# Patient Record
Sex: Male | Born: 1970 | Race: White | Hispanic: No | Marital: Single | Smoking: Current every day smoker
Health system: Southern US, Community
[De-identification: ages and names within clinical notes are randomized; demographics above are authoritative.]

## PROBLEM LIST (undated history)

## (undated) DIAGNOSIS — F419 Anxiety disorder, unspecified: Secondary | ICD-10-CM

## (undated) DIAGNOSIS — F320 Major depressive disorder, single episode, mild: Secondary | ICD-10-CM

## (undated) DIAGNOSIS — K219 Gastro-esophageal reflux disease without esophagitis: Secondary | ICD-10-CM

## (undated) DIAGNOSIS — G4700 Insomnia, unspecified: Secondary | ICD-10-CM

## (undated) DIAGNOSIS — F1910 Other psychoactive substance abuse, uncomplicated: Secondary | ICD-10-CM

## (undated) HISTORY — PX: VASECTOMY: SHX75

## (undated) HISTORY — DX: Insomnia, unspecified: G47.00

## (undated) HISTORY — DX: Anxiety disorder, unspecified: F41.9

## (undated) HISTORY — DX: Other psychoactive substance abuse, uncomplicated: F19.10

## (undated) HISTORY — DX: Gastro-esophageal reflux disease without esophagitis: K21.9

---

## 2006-11-16 ENCOUNTER — Emergency Department (HOSPITAL_COMMUNITY): Admission: EM | Admit: 2006-11-16 | Discharge: 2006-11-16 | Payer: Self-pay | Admitting: Emergency Medicine

## 2006-11-17 ENCOUNTER — Ambulatory Visit: Payer: Self-pay | Admitting: Psychiatry

## 2006-11-17 ENCOUNTER — Inpatient Hospital Stay (HOSPITAL_COMMUNITY): Admission: RE | Admit: 2006-11-17 | Discharge: 2006-11-19 | Payer: Self-pay | Admitting: Psychiatry

## 2006-11-24 ENCOUNTER — Emergency Department (HOSPITAL_COMMUNITY): Admission: EM | Admit: 2006-11-24 | Discharge: 2006-11-24 | Payer: Self-pay | Admitting: Emergency Medicine

## 2008-02-10 ENCOUNTER — Emergency Department (HOSPITAL_COMMUNITY): Admission: EM | Admit: 2008-02-10 | Discharge: 2008-02-10 | Payer: Self-pay | Admitting: Emergency Medicine

## 2009-09-25 ENCOUNTER — Emergency Department (HOSPITAL_COMMUNITY)
Admission: EM | Admit: 2009-09-25 | Discharge: 2009-09-25 | Payer: Self-pay | Source: Home / Self Care | Admitting: Emergency Medicine

## 2010-05-06 LAB — POCT RAPID STREP A (OFFICE): Streptococcus, Group A Screen (Direct): NEGATIVE

## 2010-06-04 NOTE — H&P (Signed)
NAME:  Theodore Gray, Theodore Gray NO.:  [id number]   MEDICAL RECORD NO.:  [id number]          PATIENT TYPE:  IPS   LOCATION:  0500                          FACILITY:  [facility]   PHYSICIAN:  Geoffery Lyons, M.D.      DATE OF BIRTH:  Jan 15, 1971   DATE OF ADMISSION:  11/17/2006  DATE OF DISCHARGE:                       PSYCHIATRIC ADMISSION ASSESSMENT   A 40 year old male voluntarily admitted November 17, 2006.   HISTORY OF PRESENT ILLNESS:  The patient presents with a history of  substance use and depression, has been drinking beer 2-3 times a week,  with his weekend intake increases to 8-12 beers at a time or a pint of  liquor.  His last drink was on Sunday.  Denies drinking in the morning,  denies any seizure activity.  Has been drinking for years but notices  now that it  is changing behavior.  He gets kind of violent, kicks in  doors and punches walls.  He states he gets very angry over separation  from his wife.  Also has been using cocaine, marijuana and Klonopin.  He  states his mood switches quickly, gets very tearful for no reason,  drinks at times to help him sleep.  States his mind races at night  otherwise.  He is here to get detoxed.   PAST PSYCHIATRIC HISTORY:  First admission to [facility].  No other prior detox,  no outpatient mental health services.  Longest  history of sobriety has been 2 weeks, just to see if he can do it.   SOCIAL HISTORY:  He is a 40 year old separated male, has been separated  for 1 year, married for 16 years.  He lives with his two children ages  9 and 22.  The patient works as a Database administrator, recently got  insurance, no legal problems.   FAMILY HISTORY:  None.   ALCOHOL DRUG HISTORY:  The patient smokes 2-3 packs a day.  Denies any  IV drug use.  Again has been using occasional Klonopin, cocaine and  marijuana.   PRIMARY CARE Nisaiah Bechtol:  None.   MEDICAL PROBLEMS:  No known medical problems, although he does report  recently hit a wall with his right.  He states he has sustained past  fractures in his right hand, did have his hand x-rayed and he states it  was noted to be only a contusion.   MEDICATIONS:  None prior to admission.   DRUG ALLERGIES:  To PENICILLIN from childhood, cannot recall any  reaction.   PHYSICAL EXAM:  The patient was fully assessed at  [facility] emergency  department.  His urine drug screen is positive for benzodiazepines,  positive for THC, positive for cocaine.  CBC was within normal limits.  His alcohol level was less than five.  His CMET was within normal limits  as well.   MENTAL STATUS EXAM:  He is fully alert, cooperative, good eye contact,  casually dressed.  Speech is clear, normal pace and tone.  The patient's  mood is depressed.  Affect is, he is polite.  He gets teary-eyed at  times.  Thought process are, no evidence of any thought disorder, no  suicidal thoughts, cognitive  function intact.  His memory is good.  Judgment and insight is good.  Poor impulse control.   AXIS I:  Polysubstance dependence, rule out substance induced mood  disorder.  AXIS II:  Deferred.  AXIS III:  No known medical problems, recent contusion, right hand  AXIS IV:  Psychosocial problems, problems related to a primary support  group and separation from his wife.  AXIS V:  Current is 35-40.   PLAN:  Contract for safety.  Stabilize mood and thinking.  Will detox  the patient with Librium protocol, work on relapse prevention.  Risk and  benefits of antidepressants were discussed.  The patient is agreeable to  beginning medications.  The patient will also have trazodone for sleep,  consider family session with his wife if feasible.  Case manager will  obtain a follow up with the patient.   Length of stay is 3-4 days.      Landry Corporal, N.P.      Geoffery Lyons, M.D.  Electronically Signed    JO/MEDQ  D:  11/18/2006  T:  11/18/2006  Job:  [id number]

## 2010-06-07 NOTE — Discharge Summary (Signed)
NAME:  [name], [name] NO.:  [id number]   MEDICAL RECORD NO.:  [id number]          PATIENT TYPE:  IPS   LOCATION:  0500                          FACILITY:  [facility]   PHYSICIAN:  Geoffery Lyons, M.D.      DATE OF BIRTH:  22-Jan-1970   DATE OF ADMISSION:  11/17/2006  DATE OF DISCHARGE:  11/19/2006                               DISCHARGE SUMMARY   CHIEF COMPLAINT AND PRESENT ILLNESS:  This was the first admission to  [facility] for this 40 year old male voluntarily  admitted.  History of substance abuse and depression, had been drinking  beer 2 or 3 times a week, weekend intake increased to 8-12 beers and/or  a pint of liquor.  Last drink was on Sunday.  Drinking for years and now  __________  that it is changing his behavior, gets violent, kicks in  door and punches wall.  Angry over the separation from his wife.  Using  cocaine, marijuana and Klonopin.  Claimed his mood switches quickly,  tearful for no reason, mind races.   PAST PSYCHIATRIC HISTORY:  [facility].  No other  detox, longest sobriety 2 weeks ago.   ALCOHOL AND DRUG HISTORY:  Using occasional Klonopin, cocaine and  marijuana, and drinking 8-12 beers 2 or 3 times a week.   MEDICAL HISTORY:  Noncontributory.   MEDICATIONS:  None.   PHYSICAL EXAM:  Performed, no acute findings.   LABORATORY WORKUP:  UDS positive for benzodiazepines, marijuana and  cocaine.  CBC within normal limits.  __________  within normal limits.   MENTAL STATUS EXAM:  Reveals a fully alert cooperative male with good  eye contact.  Casually dressed.  Speech was clear, normal pace and tone.  Mood depressed.  Affect depressed.  Teary-eyed at times.  Thought  processes are clear, rational and goal oriented.  No active suicidal or  homicidal ideas, no hallucinations.  Cognition well-preserved.   ADMITTING DIAGNOSES:  Axis I:  Alcohol, marijuana, cocaine and  benzodiazepine abuse, rule out dependence;  impulse control, not  otherwise specified; rule out substance-induced mood disorder.  Axis II:  No diagnosis.  Axis III:  No diagnosis.  Axis IV: Moderate.  Axis V:  Upon admission 35, highest Global Assessment of Functioning in  the last year 60.   COURSE IN THE HOSPITAL:  He was admitted, started in individual and  group psychotherapy.  We detoxed with Librium.  We started him on  trazodone for sleep, endorsed that for years has had a love and hate  relationship with his wife.  One day she left, took the kids with her,  and they did not come back.  He actually has a girlfriend, got into  drinking a 6-pack of beer a day, some cocaine, drinking almost every  day.  Endorsed temper outbursts, can go from 0 to 12.  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  The father  has history of bad temper, grandfather alcohol.  Endorsed that Saturday  before he was admitted he got  really upset and increased his use of  alcohol and cocaine.  October 30 he endorsed no suicidal ideas, no  hallucinations or delusions.  He states he was ready go home.  Family  was going to be visiting and he really wanted to be discharged.  He was  willing to pursue outpatient treatment and he was committed to sobriety.  As he was indeed improved and there were no indications of acute  withdrawal or suicidal or homicidal ideations, and the family was okay  with him being discharged, we went ahead and discharged to outpatient  follow-up.   DISCHARGE DIAGNOSES:  Axis I:  Alcohol and cocaine, benzodiazepine,  marijuana abuse; impulse control, not otherwise specified; rule out  substance-induced mood disorder.  Axis II:  No diagnosis.  Axis III:  No diagnosis  Axis IV:  Moderate.  Axis V:  Upon discharge 55-60.   DISCHARGED ON:  1. Wellbutrin XL 150 mg in the morning.  2. Trazodone 50 mg at bedtime for sleep.   FOLLOW-UP PLANS:  On an outpatient basis.      Geoffery Lyons, M.D.   Electronically Signed     IL/MEDQ  D:  12/10/2006  T:  12/11/2006  Job:  [id number]

## 2010-10-29 LAB — DIFFERENTIAL
Basophils Absolute: 0
Eosinophils Absolute: 0.3
Eosinophils Relative: 3
Lymphocytes Relative: 30
Monocytes Relative: 8
Neutrophils Relative %: 58

## 2010-10-29 LAB — CBC
HCT: 40.5
MCHC: 34.9
Platelets: 238
WBC: 8.8

## 2010-10-29 LAB — ETHANOL: Alcohol, Ethyl (B): 180 — ABNORMAL HIGH

## 2010-10-29 LAB — BASIC METABOLIC PANEL
CO2: 31
Chloride: 105
GFR calc non Af Amer: >60
Sodium: 142

## 2010-10-29 LAB — URINALYSIS, ROUTINE W REFLEX MICROSCOPIC
Glucose, UA: NEGATIVE
Nitrite: NEGATIVE

## 2010-10-29 LAB — RAPID URINE DRUG SCREEN, HOSP PERFORMED
Amphetamines: NOT DETECTED
Benzodiazepines: POSITIVE — AB
Opiates: NOT DETECTED

## 2010-10-29 LAB — URINE MICROSCOPIC-ADD ON

## 2010-10-30 LAB — DIFFERENTIAL
Basophils Absolute: 0
Basophils Relative: 0
Eosinophils Relative: 2
Lymphocytes Relative: 22
Lymphs Abs: 2.2
Monocytes Relative: 8
Neutro Abs: 6.9

## 2010-10-30 LAB — COMPREHENSIVE METABOLIC PANEL
Alkaline Phosphatase: 66
CO2: 32
Chloride: 105

## 2010-10-30 LAB — URINALYSIS, ROUTINE W REFLEX MICROSCOPIC
Bilirubin Urine: NEGATIVE
Ketones, ur: NEGATIVE
Nitrite: NEGATIVE
Urobilinogen, UA: 1

## 2010-10-30 LAB — CBC
MCHC: 35
MCV: 90.2
Platelets: 246
RBC: 4.96
RDW: 12.5

## 2010-10-30 LAB — RAPID URINE DRUG SCREEN, HOSP PERFORMED
Cocaine: POSITIVE — AB
Opiates: NOT DETECTED
Tetrahydrocannabinol: POSITIVE — AB

## 2011-06-26 ENCOUNTER — Encounter: Payer: Self-pay | Admitting: Gastroenterology

## 2011-07-14 ENCOUNTER — Ambulatory Visit: Payer: Self-pay | Admitting: Gastroenterology

## 2011-07-14 ENCOUNTER — Telehealth: Payer: Self-pay | Admitting: Gastroenterology

## 2011-07-14 NOTE — Telephone Encounter (Signed)
Pt was a no show

## 2011-07-14 NOTE — Telephone Encounter (Signed)
PCP is aware that patient has a nonworking phone and appt card was mailed to him.

## 2012-03-11 ENCOUNTER — Encounter: Payer: Self-pay | Admitting: Family Medicine

## 2012-03-11 DIAGNOSIS — F17200 Nicotine dependence, unspecified, uncomplicated: Secondary | ICD-10-CM | POA: Insufficient documentation

## 2012-03-11 DIAGNOSIS — K219 Gastro-esophageal reflux disease without esophagitis: Secondary | ICD-10-CM

## 2012-03-11 DIAGNOSIS — F411 Generalized anxiety disorder: Secondary | ICD-10-CM | POA: Insufficient documentation

## 2012-03-11 DIAGNOSIS — F1010 Alcohol abuse, uncomplicated: Secondary | ICD-10-CM | POA: Insufficient documentation

## 2012-03-11 DIAGNOSIS — G4700 Insomnia, unspecified: Secondary | ICD-10-CM | POA: Insufficient documentation

## 2012-04-13 ENCOUNTER — Encounter: Payer: Self-pay | Admitting: Family Medicine

## 2012-04-13 ENCOUNTER — Ambulatory Visit (INDEPENDENT_AMBULATORY_CARE_PROVIDER_SITE_OTHER): Payer: BC Managed Care – PPO | Admitting: Family Medicine

## 2012-04-13 VITALS — BP 136/78 | HR 93 | Temp 98.1°F | Resp 18 | Wt 184.0 lb

## 2012-04-13 DIAGNOSIS — G4700 Insomnia, unspecified: Secondary | ICD-10-CM

## 2012-04-13 DIAGNOSIS — K219 Gastro-esophageal reflux disease without esophagitis: Secondary | ICD-10-CM

## 2012-04-13 DIAGNOSIS — M5380 Other specified dorsopathies, site unspecified: Secondary | ICD-10-CM

## 2012-04-13 DIAGNOSIS — F411 Generalized anxiety disorder: Secondary | ICD-10-CM

## 2012-04-13 DIAGNOSIS — M62830 Muscle spasm of back: Secondary | ICD-10-CM

## 2012-04-13 MED ORDER — CARISOPRODOL 350 MG PO TABS: 350.0000 mg | ORAL_TABLET | Freq: Four times a day (QID) | ORAL | Status: DC | PRN

## 2012-04-13 MED ORDER — OMEPRAZOLE 20 MG PO CPDR: 20.0000 mg | DELAYED_RELEASE_CAPSULE | Freq: Every day | ORAL | Status: DC

## 2012-04-13 NOTE — Progress Notes (Signed)
Subjective:     Patient ID: Theodore Gray, male   DOB: Oct 31, 1970, 42 y.o.   MRN: [id number]  HPI Patient is here for followup of his anxiety disorder/insomnia-  he's been taking Zoloft 50 mg by mouth daily for the last 2 months. He reports no benefit and has not noticed any perceptible change in his anxiety. Furthermore is not able to sleep. He reports a restless in his legs at night movement the states for his restlessness in his limbs keeps him awake. His girlfriend frequently complains about him kicking her or bumping into her his legs while he sleeps.  He denies any depression or suicidal thoughts.  2 weeks ago he was lifting a truck body, he felt a tearing sensation just medial to his left scapula.  He has had near constant pain in that area ever since. There is no radiation of the pain. Denies any numbness or tingling in his left arm. He denies any neck pain. He denies any paresthesias in his left leg her right leg. He denies any shortness of breath, dyspnea on exertion, chest pain, angina, nausea or vomiting.  He denies any pleurisy.  He denies any shoulder pain. Past Medical History  Diagnosis Date  . GERD (gastroesophageal reflux disease)   . Substance abuse     ALCOHOL  . Anxiety   . Insomnia    His medicines include Prilosec 20 mg by mouth daily and Zoloft 50 mg by mouth daily. He has an allergy to penicillin History   Social History  . Marital Status: Single    Spouse Name: N/A    Number of Children: N/A  . Years of Education: N/A   Occupational History  . Not on file.   Social History Main Topics  . Smoking status: Current Every Day Smoker -- 1.50 packs/day    Types: Cigarettes  . Smokeless tobacco: Never Used  . Alcohol Use: No  . Drug Use: No  . Sexually Active: Not on file   Other Topics Concern  . Not on file   Social History Narrative  . No narrative on file       Review of Systems    Review of systems is otherwise negative  Objective:   Physical  Exam  Constitutional: He appears well-developed and well-nourished. No distress.  HENT:  Head: Normocephalic and atraumatic.  Right Ear: External ear normal.  Left Ear: External ear normal.  Mouth/Throat: Oropharynx is clear and moist. No oropharyngeal exudate.  Eyes: Conjunctivae and EOM are normal. Pupils are equal, round, and reactive to light.  Neck: Normal range of motion. Neck supple. No JVD present. No tracheal deviation present. No thyromegaly present.  Cardiovascular: Normal rate, regular rhythm and normal heart sounds.  Exam reveals no gallop and no friction rub.   No murmur heard. Pulmonary/Chest: Effort normal and breath sounds normal. No respiratory distress. He has no wheezes. He has no rales. He exhibits no tenderness.  Abdominal: Soft. Bowel sounds are normal. He exhibits no distension and no mass. There is no tenderness. There is no rebound and no guarding.  Musculoskeletal:       Thoracic back: He exhibits tenderness, pain and spasm. He exhibits normal range of motion, no bony tenderness, no swelling, no edema and no deformity.       Back:  Lymphadenopathy:    He has no cervical adenopathy.  Skin: He is not diaphoretic.   location of the pain is circled on drawing.  He has pain with subscapularis  liftoff.  He has a negative Hawkins and a negative empty can sign. He has a negative Spurling's maneuver.      Assessment:     Thoracic muscle strain  Anxiety disorder  Insomnia  Possible restless limb syndrome  GERD    Plan:     Zoloft has not been effective. Therefore am asking him to decrease it to 25 mg by mouth daily for the next 2 weeks and then discontinued altogether.  For his muscle strain I'm prescribing soma 350 mg by mouth every 6 hours when necessary muscle spasms. I warned him about sedation on this medicine and possible habituation.  After 2 weeks, assuming his muscle pain is better, I will try him on trazodone 100 mg by mouth each bedtime for insomnia.  If  trazodone is ineffective we can try medicines for restless leg syndrome such as Requip..  I refilled his omeprazole as he has daily GERD.

## 2012-04-30 ENCOUNTER — Encounter: Payer: Self-pay | Admitting: Physician Assistant

## 2012-04-30 ENCOUNTER — Telehealth: Payer: Self-pay | Admitting: Physician Assistant

## 2012-04-30 ENCOUNTER — Ambulatory Visit (INDEPENDENT_AMBULATORY_CARE_PROVIDER_SITE_OTHER): Payer: BC Managed Care – PPO | Admitting: Physician Assistant

## 2012-04-30 VITALS — BP 136/100 | HR 76 | Temp 98.7°F | Resp 18 | Ht 68.0 in | Wt 188.0 lb

## 2012-04-30 DIAGNOSIS — F411 Generalized anxiety disorder: Secondary | ICD-10-CM

## 2012-04-30 DIAGNOSIS — M546 Pain in thoracic spine: Secondary | ICD-10-CM

## 2012-04-30 DIAGNOSIS — G4700 Insomnia, unspecified: Secondary | ICD-10-CM

## 2012-04-30 DIAGNOSIS — K219 Gastro-esophageal reflux disease without esophagitis: Secondary | ICD-10-CM

## 2012-04-30 MED ORDER — TRAMADOL HCL 50 MG PO TABS: 50.0000 mg | ORAL_TABLET | Freq: Four times a day (QID) | ORAL | Status: DC | PRN

## 2012-04-30 MED ORDER — SERTRALINE HCL 50 MG PO TABS: 50.0000 mg | ORAL_TABLET | Freq: Every day | ORAL | Status: DC

## 2012-04-30 NOTE — Telephone Encounter (Signed)
Prescriptions was sent to wrong pharmacy([facility]) sent rx to right pharmacy

## 2012-05-01 NOTE — Progress Notes (Signed)
Patient ID: Theodore Gray MRN: [id number], DOB: [birth year], 42 y.o. Date of Encounter: @DATE @  Chief Complaint:  Chief Complaint  Patient presents with  . Medication Problem    see last ov 3/25  prob with back pain/anxiety meds    HPI: 42 y.o. year old male  presents with:  1-Back Pain in upper back. Was seen here a couple weeks ago- was given muscle relaxer-says this did not help at all. Still has significant pain. 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 Says he needs something to take for pain that he can take while at work. Also wants xray.  2-Anxiety: Was seen 02/23/12 here and started Zoloft 50. When he was seen couple weeks ago reg back pain, he reported that the zoloft wasn't helping so this was weaned off. However, today pt and girlfriend both report that it was helping. He says "I guess I just didn't realize how much better I was with the medicine" but since stopping it, definitely feels much worse. He says that when he was on Zoloft coworkers commented how much better he was. Girlfriend says she noticed that it did help. Pt now realizes that he feels much, much worse and more anxious since off the med.   Past Medical History  Diagnosis Date  . GERD (gastroesophageal reflux disease)   . Substance abuse     ALCOHOL  . Anxiety   . Insomnia      Home Meds: Current Outpatient Prescriptions on File Prior to Visit  Medication Sig Dispense Refill  . omeprazole (PRILOSEC) 20 MG capsule Take 1 capsule (20 mg total) by mouth daily.  30 capsule  11  . carisoprodol (SOMA) 350 MG tablet Take 1 tablet (350 mg total) by mouth 4 (four) times daily as needed for muscle spasms.  60 tablet  0   No current facility-administered medications on file prior to visit.    Allergies:  Allergies  Allergen Reactions  . Penicillins Rash    History    Social History  . Marital Status: Single    Spouse Name: N/A    Number of Children: N/A  . Years of Education: N/A   Occupational History  . Not on file.   Social History Main Topics  . Smoking status: Current Every Day Smoker -- 1.50 packs/day    Types: Cigarettes  . Smokeless tobacco: Never Used  . Alcohol Use: No  . Drug Use: No  . Sexually Active: Not on file   Other Topics Concern  . Not on file   Social History Narrative  . No narrative on file    No family history on file.   Review of Systems:No numbness, tingling in back Constitutional: negative for chills, fever, night sweats, weight changes, or fatigue  Respiratory: negative for hemoptysis, wheezing, shortness of breath, or cough Abdominal: negative for abdominal pain, nausea, vomiting, diarrhea, or constipation Dermatological: negative for rash or concerning skin lesions Neurologic: negative for headache, dizziness, or syncope All other systems reviewed and are otherwise negative with the exception to those above and in the HPI.   Physical Exam: Blood pressure 136/100, pulse 76, temperature 98.7 F (37.1 C), temperature source Oral, resp. rate 18, height 5\' 8"  (1.727 m), weight 188 lb (85.276 kg)., Body mass index is 28.59 kg/(m^2). General: Well developed, well nourished,WM with tattoos, talks fast, anxious. in  no acute distress. Neck: Supple. No thyromegaly. Full ROM. No lymphadenopathy. Lungs: Clear bilaterally to auscultation without wheezes, rales, or rhonchi. Breathing is unlabored. Heart: RRR. Musculoskeletal:  Back, medial aspect of scapula, is area of tenderness, tightness with palpation. No pain in midline at spine. Extremities/Skin: Warm and dry. No clubbing or cyanosis. No edema. No rashes or suspicious lesions. Neuro: Alert and oriented X 3. Moves all extremities spontaneously. Gait is normal. CNII-XII grossly in tact. Psych:  He talks very fast, anxious.    ASSESSMENT AND PLAN:  42 y.o.  year old male with  1. GAD (generalized anxiety disorder) Will restartZoloft 50 mg QD. Will return for f/u in 6 weeks.  2. Thoracic back pain Will obtain xray. Will prescribe Tramadol for pain. He is to use heat and stretch routinely. - DG Thoracic Spine W/Swimmers; Future  3. GERD (gastroesophageal reflux disease) Controlled with omeprazole.  4. Insomnia He is having difficulty sleeping. Will use tramadol for pain and zoloft for anxiety then f/u this at the f/u in 6 weeks.   [address] [place], [place], [facility] 05/01/2012 7:35 AM

## 2012-05-03 ENCOUNTER — Telehealth: Payer: Self-pay | Admitting: Physician Assistant

## 2012-05-03 DIAGNOSIS — M62830 Muscle spasm of back: Secondary | ICD-10-CM

## 2012-05-03 MED ORDER — CARISOPRODOL 350 MG PO TABS: 350.0000 mg | ORAL_TABLET | Freq: Four times a day (QID) | ORAL | Status: DC | PRN

## 2012-05-03 NOTE — Telephone Encounter (Signed)
Seen by Dr Tanya Nones on 3/25 for muscle spasms.  Given carisoprodol 350 QID prn #30.  Pt now requesting refill.  Please advise.

## 2012-05-03 NOTE — Telephone Encounter (Signed)
Medication refilled per protocol.Patient needs to be seen before any further refills 

## 2012-05-03 NOTE — Telephone Encounter (Signed)
Ok to refill #30

## 2012-05-05 ENCOUNTER — Ambulatory Visit
Admission: RE | Admit: 2012-05-05 | Discharge: 2012-05-05 | Disposition: A | Discharge status: Home / Self Care | Payer: BC Managed Care – PPO | Source: Ambulatory Visit | Attending: Physician Assistant | Admitting: Physician Assistant

## 2012-05-05 DIAGNOSIS — M546 Pain in thoracic spine: Secondary | ICD-10-CM

## 2012-05-10 ENCOUNTER — Telehealth: Payer: Self-pay | Admitting: Physician Assistant

## 2012-05-10 DIAGNOSIS — M62830 Muscle spasm of back: Secondary | ICD-10-CM

## 2012-05-10 MED ORDER — CARISOPRODOL 350 MG PO TABS: 350.0000 mg | ORAL_TABLET | Freq: Four times a day (QID) | ORAL | Status: DC | PRN

## 2012-05-10 NOTE — Telephone Encounter (Signed)
cARISOPRODOL 350MG  TAKE 1 TABLET 4 TIMES A DAY AS NEEDED FOR MUSCLE SPASMS #60 LAST RF 04/13/12

## 2012-05-10 NOTE — Addendum Note (Signed)
Addended by: Donne Anon on: 05/10/2012 03:56 PM   Modules accepted: Orders

## 2012-05-10 NOTE — Telephone Encounter (Signed)
Approved. #90+ one additional refill 

## 2012-05-10 NOTE — Telephone Encounter (Signed)
Last OV 04/30/12.  Last refill 4/14 #30. Need approval.

## 2012-05-10 NOTE — Telephone Encounter (Signed)
Med called to pharmacy 

## 2012-07-05 ENCOUNTER — Telehealth: Payer: Self-pay | Admitting: Family Medicine

## 2012-07-05 DIAGNOSIS — F411 Generalized anxiety disorder: Secondary | ICD-10-CM

## 2012-07-05 NOTE — Telephone Encounter (Signed)
MBD pt 

## 2012-07-06 MED ORDER — SERTRALINE HCL 50 MG PO TABS: 50.0000 mg | ORAL_TABLET | Freq: Every day | ORAL | Status: DC

## 2012-07-06 NOTE — Telephone Encounter (Signed)
Medication refilled per protocol. 

## 2012-07-14 ENCOUNTER — Encounter: Payer: Self-pay | Admitting: Physician Assistant

## 2012-07-14 ENCOUNTER — Ambulatory Visit (INDEPENDENT_AMBULATORY_CARE_PROVIDER_SITE_OTHER): Payer: BC Managed Care – PPO | Admitting: Physician Assistant

## 2012-07-14 VITALS — BP 116/86 | HR 68 | Temp 98.3°F | Resp 18 | Ht 69.5 in | Wt 182.0 lb

## 2012-07-14 DIAGNOSIS — F411 Generalized anxiety disorder: Secondary | ICD-10-CM

## 2012-07-14 DIAGNOSIS — F1010 Alcohol abuse, uncomplicated: Secondary | ICD-10-CM

## 2012-07-14 DIAGNOSIS — F17200 Nicotine dependence, unspecified, uncomplicated: Secondary | ICD-10-CM

## 2012-07-14 DIAGNOSIS — L0390 Cellulitis, unspecified: Secondary | ICD-10-CM

## 2012-07-14 DIAGNOSIS — K219 Gastro-esophageal reflux disease without esophagitis: Secondary | ICD-10-CM

## 2012-07-14 DIAGNOSIS — L0291 Cutaneous abscess, unspecified: Secondary | ICD-10-CM

## 2012-07-14 DIAGNOSIS — G4700 Insomnia, unspecified: Secondary | ICD-10-CM

## 2012-07-14 DIAGNOSIS — G2581 Restless legs syndrome: Secondary | ICD-10-CM

## 2012-07-14 MED ORDER — DOXYCYCLINE HYCLATE 100 MG PO TABS: 100.0000 mg | ORAL_TABLET | Freq: Two times a day (BID) | ORAL | Status: DC

## 2012-07-14 MED ORDER — SULFAMETHOXAZOLE-TMP DS 800-160 MG PO TABS: 1.0000 | ORAL_TABLET | Freq: Two times a day (BID) | ORAL | Status: DC

## 2012-07-14 MED ORDER — ROPINIROLE HCL 0.25 MG PO TABS: 0.2500 mg | ORAL_TABLET | Freq: Three times a day (TID) | ORAL | Status: DC

## 2012-07-14 MED ORDER — SERTRALINE HCL 100 MG PO TABS: 100.0000 mg | ORAL_TABLET | Freq: Every day | ORAL | Status: DC

## 2012-07-14 NOTE — Progress Notes (Signed)
Patient ID: Theodore Gray MRN: [id number], DOB: July 18, 1970, 42 y.o. Date of Encounter: @DATE @  Chief Complaint:  Chief Complaint  Patient presents with  . wants to discuss zoloft, sleeping, restless leg  . infection in left index finger    HPI: 42 y.o. year old white male  With lots of tattoos presents for f/u OV.   1-Anxiety: At LOV 02/23/12 he reported that he "felt on edge all the time, felt overwhelmed at times, and that mind races" . Couldn't sleep /c mind racing. Started Zolft 50mg  QD. He says this has helped. He feel like he has more patience. He thinks his mind racing may have gotten a little better.   2-Insomnia: In addition to not being able to sleep good sec to anxiety, he also says he doesn't sleep good b/c of legs. Says that at night when sitting still feels a creepy crawly feeling on legs. Has to get up and walk around. Also during sleep, is awoken b/c of his legs "jump' throughout the night.   3-left 2nd finger: "knocked off surface of knuckle 4-5 weeks ago' but just deeloped white area 4-5 days ago. "I'm always getting banged up--didn't think much about it."   4- Still using no alcohol!! 5- Smoking is down to 1 ppd.  No other complaints.   Past Medical History  Diagnosis Date  . GERD (gastroesophageal reflux disease)   . Substance abuse     ALCOHOL  . Anxiety   . Insomnia      Home Meds: See attached medication section for current medication list. Any medications entered into computer today will not appear on this note's list. The medications listed below were entered prior to today. Current Outpatient Prescriptions on File Prior to Visit  Medication Sig Dispense Refill  . omeprazole (PRILOSEC) 20 MG capsule Take 1 capsule (20 mg total) by mouth daily.  30 capsule  11  . carisoprodol (SOMA) 350 MG tablet Take 1 tablet (350 mg total) by mouth 4 (four) times daily as needed for muscle spasms.  90 tablet  1  . traMADol (ULTRAM) 50 MG tablet Take 1 tablet (50 mg  total) by mouth every 6 (six) hours as needed for pain.  60 tablet  0   No current facility-administered medications on file prior to visit.    Allergies:  Allergies  Allergen Reactions  . Penicillins Rash    History   Social History  . Marital Status: Single    Spouse Name: N/A    Number of Children: N/A  . Years of Education: N/A   Occupational History  . Not on file.   Social History Main Topics  . Smoking status: Current Every Day Smoker -- 1.50 packs/day    Types: Cigarettes  . Smokeless tobacco: Never Used  . Alcohol Use: No  . Drug Use: No  . Sexually Active: Not on file   Other Topics Concern  . Not on file   Social History Narrative  . No narrative on file    No family history on file.   Review of Systems:  See HPI for pertinent ROS. All other ROS negative.    Physical Exam: Blood pressure 116/86, pulse 68, temperature 98.3 F (36.8 C), temperature source Oral, resp. rate 18, height 5' 9.5" (1.765 m), weight 182 lb (82.555 kg)., Body mass index is 26.5 kg/(m^2). General: WNWD WM Multiple Tattoos. Appears in no acute distress. Lungs: Clear bilaterally to auscultation without wheezes, rales, or rhonchi. Breathing is unlabored. Heart:  RRR with S1 S2. No murmurs, rubs, or gallops. Musculoskeletal:  Strength and tone normal for age. Extremities/Skin: Left 2nd Finger; at DIP joint: 1 cm diameter of erythema.   1/2 cm area of opague yellowish area.  Neuro: Alert and oriented X 3. Moves all extremities spontaneously. Gait is normal. CNII-XII grossly in tact. Psych:  Responds to questions appropriately with a normal affect.     ASSESSMENT AND PLAN:  42 y.o. year old male with  1. Abscess Site Cleansed. Scapel used to incise and drain. Culture sent. Take abx. If site worsens or does not resolve, f/u.  - Culture, routine-abscess - sulfamethoxazole-trimethoprim (BACTRIM DS) 800-160 MG per tablet; Take 1 tablet by mouth 2 (two) times daily.  Dispense: 28  tablet; Refill: 0 - doxycycline (VIBRA-TABS) 100 MG tablet; Take 1 tablet (100 mg total) by mouth 2 (two) times daily.  Dispense: 20 tablet; Refill: 0  2. GAD (generalized anxiety disorder) Symptoms are improved but not controlled. Will increase Zoloft from 50mg  to 100mg .  - sertraline (ZOLOFT) 100 MG tablet; Take 1 tablet (100 mg total) by mouth daily.  Dispense: 30 tablet; Refill: 3  3. Insomnia This should improve with increase in zoloft and with requip use.  - COMPLETE METABOLIC PANEL WITH GFR  4. Restless leg syndrome Will check iron etc to R/O underlying etiology. If labs normal, will start Requip and increase dose as below. If still not controlled on 2 pills, he is to call for further instructions regarding titration of medication. - COMPLETE METABOLIC PANEL WITH GFR - CBC with Differential - Iron - rOPINIRole (REQUIP) 0.25 MG tablet;  Take one 1-3 hours prior to sleep for 2 days then increase to two-1-3 hours prior to sleep.  Dispense: 60 tablet; Refill: 0  5. GERD (gastroesophageal reflux disease) Controlled. Cont current med.  6. Smoker Cont to decrease - COMPLETE METABOLIC PANEL WITH GFR  7. Alcohol abuse Good job on cessation!! - COMPLETE METABOLIC PANEL WITH GFR  F/U OV 2 months, sooner if needed.  [address] [place], [place], [facility] 07/14/2012 5:22 PM

## 2012-07-15 LAB — CBC WITH DIFFERENTIAL/PLATELET
Basophils Absolute: 0 10*3/uL (ref 0.0–0.1)
Basophils Relative: 0 % (ref 0–1)
Lymphocytes Relative: 28 % (ref 12–46)
MCHC: 34.8 g/dL (ref 30.0–36.0)
Neutro Abs: 5.9 10*3/uL (ref 1.7–7.7)
Platelets: 236 10*3/uL (ref 150–400)
RDW: 14 % (ref 11.5–15.5)
WBC: 9.4 10*3/uL (ref 4.0–10.5)

## 2012-07-15 LAB — COMPLETE METABOLIC PANEL WITH GFR
Albumin: 4.6 g/dL (ref 3.5–5.2)
BUN: 13 mg/dL (ref 6–23)
CO2: 27 mEq/L (ref 19–32)
Calcium: 9.7 mg/dL (ref 8.4–10.5)
Chloride: 105 mEq/L (ref 96–112)
GFR, Est African American: >89 mL/min
GFR, Est Non African American: >89 mL/min
Glucose, Bld: 89 mg/dL (ref 70–99)
Potassium: 3.9 mEq/L (ref 3.5–5.3)
Total Protein: 6.8 g/dL (ref 6.0–8.3)

## 2012-07-19 LAB — CULTURE, ROUTINE-ABSCESS

## 2012-07-26 ENCOUNTER — Encounter: Payer: Self-pay | Admitting: Family Medicine

## 2012-09-13 ENCOUNTER — Telehealth: Payer: Self-pay | Admitting: Physician Assistant

## 2012-09-13 ENCOUNTER — Telehealth: Payer: Self-pay | Admitting: Family Medicine

## 2012-09-13 DIAGNOSIS — G2581 Restless legs syndrome: Secondary | ICD-10-CM

## 2012-09-13 MED ORDER — PRAMIPEXOLE DIHYDROCHLORIDE 0.25 MG PO TABS: 0.5000 mg | ORAL_TABLET | Freq: Every evening | ORAL | Status: DC | PRN

## 2012-09-13 MED ORDER — ROPINIROLE HCL 0.25 MG PO TABS: 0.2500 mg | ORAL_TABLET | Freq: Three times a day (TID) | ORAL | Status: DC

## 2012-09-13 NOTE — Telephone Encounter (Signed)
Pharmacist called to report medication error on their part.  Provider had ordered Ropinirole (Requip) 0.25 mg for patient on 07/14/12.  Today pharmacy has asked for refill of Pramipexole Dihydroc (Mirapex) 0.25 mg.  Pharmacy states both medication for same problem and yes they did not fill original order as written.  Provider was informed and asked dif OK to refill Pramipexole Dihydroc as patient states to pharmacist is working well.  OK to refill per provider.  Patient has follow up visit here later this week and provider will follow up then with patient as well.

## 2012-09-13 NOTE — Telephone Encounter (Signed)
Has follow up appt later this week.  One refill sent

## 2012-09-13 NOTE — Telephone Encounter (Signed)
Pramipexole Dihydroc 0.25 mg tab 1 tab 1-3 hours prior to sleep for 2 days, then increase to 2 tab 1-3 hours prior to sleep #60 last rf 07/14/12

## 2012-09-15 ENCOUNTER — Ambulatory Visit: Payer: BC Managed Care – PPO | Admitting: Physician Assistant

## 2012-10-07 ENCOUNTER — Encounter: Payer: Self-pay | Admitting: Family Medicine

## 2012-10-07 ENCOUNTER — Ambulatory Visit (INDEPENDENT_AMBULATORY_CARE_PROVIDER_SITE_OTHER): Payer: BC Managed Care – PPO | Admitting: Family Medicine

## 2012-10-07 VITALS — BP 118/80 | HR 62 | Temp 97.9°F | Resp 16 | Ht 69.0 in | Wt 188.0 lb

## 2012-10-07 DIAGNOSIS — G5600 Carpal tunnel syndrome, unspecified upper limb: Secondary | ICD-10-CM

## 2012-10-07 MED ORDER — PREDNISONE 20 MG PO TABS: ORAL_TABLET | ORAL | Status: DC

## 2012-10-07 NOTE — Progress Notes (Signed)
Subjective:    Patient ID: Theodore Gray, male    DOB: 1970/06/23, 42 y.o.   MRN: [id number]  HPI Patient is a pleasant 42 year old white male who comes in complaining of pain in both hands right greater than left. The pain is distal to the wrists bilaterally. He complains of burning pain, tingling pains in both hands. It is worse when he sleeps at night, talks on the telephone, or when he is using his hands at work. He works as a Optometrist.  He denies any neck pain.  He denies any neck injury. He denies any cervical radiculopathy.  This has been steadily worsening for months. Past Medical History  Diagnosis Date  . GERD (gastroesophageal reflux disease)   . Substance abuse     ALCOHOL  . Anxiety   . Insomnia    No past surgical history on file. Current Outpatient Prescriptions on File Prior to Visit  Medication Sig Dispense Refill  . omeprazole (PRILOSEC) 20 MG capsule Take 1 capsule (20 mg total) by mouth daily.  30 capsule  11  . pramipexole (MIRAPEX) 0.25 MG tablet Take 2 tablets (0.5 mg total) by mouth at bedtime as needed and may repeat dose one time if needed (take 1-3 hrs before bedtime).  60 tablet  0  . sertraline (ZOLOFT) 100 MG tablet Take 1 tablet (100 mg total) by mouth daily.  30 tablet  3   No current facility-administered medications on file prior to visit.   Allergies  Allergen Reactions  . Penicillins Rash   History   Social History  . Marital Status: Single    Spouse Name: N/A    Number of Children: N/A  . Years of Education: N/A   Occupational History  . Not on file.   Social History Main Topics  . Smoking status: Current Every Day Smoker -- 1.50 packs/day    Types: Cigarettes  . Smokeless tobacco: Never Used  . Alcohol Use: No  . Drug Use: No  . Sexual Activity: Not on file   Other Topics Concern  . Not on file   Social History Narrative  . No narrative on file      Review of Systems  All other systems reviewed and are  negative.       Objective:   Physical Exam  Vitals reviewed. Constitutional: He is oriented to person, place, and time. He appears well-developed and well-nourished.  Cardiovascular: Normal rate and regular rhythm.   Pulmonary/Chest: Effort normal and breath sounds normal.  Abdominal: Soft. Bowel sounds are normal.  Neurological: He is alert and oriented to person, place, and time. He has normal reflexes. He displays normal reflexes. No cranial nerve deficit. He exhibits normal muscle tone. Coordination normal.   patient has a positive Tinel sign in both hands. He has a positive Phalen's sign in both hands. He has no weakness in grip strength.        Assessment & Plan:  1. Carpal tunnel syndrome, unspecified laterality We discussed the options for therapy including cortisone shots, anti-inflammatories, wrist splints, and surgery. Given his occupation he is not able to change activities to modify and prevent the carpal tunnel syndrome. He also does not believe he is to be able to wear wrist splints because he is currently working in motors and tight spaces.  He likes try prednisone taper pack and then see an orthopedic surgeon about possible surgery to treat carpal tunnel syndrome. - predniSONE (DELTASONE) 20 MG tablet; 3 tabs poqday 1-2,  2 tabs poqday 3-4, 1 tab poqday 5-6  Dispense: 12 tablet; Refill: 0 - Ambulatory referral to Hand Surgery

## 2012-10-08 ENCOUNTER — Encounter: Payer: Self-pay | Admitting: Physician Assistant

## 2012-10-18 ENCOUNTER — Telehealth: Payer: Self-pay | Admitting: Physician Assistant

## 2012-10-18 MED ORDER — PRAMIPEXOLE DIHYDROCHLORIDE 0.25 MG PO TABS: 0.5000 mg | ORAL_TABLET | Freq: Every evening | ORAL | Status: DC | PRN

## 2012-10-18 NOTE — Telephone Encounter (Signed)
Pramipexole Dihydroc 0.25 mg tab 2 1-3 hours prior to bedtime #60

## 2012-11-15 ENCOUNTER — Telehealth: Payer: Self-pay | Admitting: Family Medicine

## 2012-11-15 DIAGNOSIS — F411 Generalized anxiety disorder: Secondary | ICD-10-CM

## 2012-11-15 NOTE — Telephone Encounter (Signed)
Refill Sertraline 100 mg  And Carisoprodol 350 mg  OK refill?

## 2012-11-17 ENCOUNTER — Encounter: Payer: Self-pay | Admitting: Family Medicine

## 2012-11-17 MED ORDER — SERTRALINE HCL 100 MG PO TABS: 100.0000 mg | ORAL_TABLET | Freq: Every day | ORAL | Status: DC

## 2012-11-17 NOTE — Telephone Encounter (Signed)
Last office visit regarding this problem was June 25. At that visit his Zoloft dose was increased and he was to followup in 2 months. Overdue for followup office visit. No further refills until schedules office visit. Then can fill times one to hold him until that appointment if needed. However make yourself a sticky note not to refill any further until he has a visit.

## 2012-11-17 NOTE — Telephone Encounter (Signed)
Zoloft refill for one month.  Patient sent letter no more refills until seen in office.  Muscle relaxer not refilled

## 2012-11-18 ENCOUNTER — Telehealth: Payer: Self-pay | Admitting: Family Medicine

## 2012-11-18 NOTE — Telephone Encounter (Signed)
Carisoprodol denied.  We have never prescribed this.

## 2012-11-19 ENCOUNTER — Telehealth: Payer: Self-pay | Admitting: Family Medicine

## 2012-11-19 NOTE — Telephone Encounter (Signed)
Pharmacy calling about Soma refill.  This was removed from patient med profile at last visit.  Past refills stated that NTBS for further refills.  Refill denied and pharmacy to tell patient NTBS

## 2012-12-08 ENCOUNTER — Ambulatory Visit (INDEPENDENT_AMBULATORY_CARE_PROVIDER_SITE_OTHER): Payer: BC Managed Care – PPO | Admitting: Physician Assistant

## 2012-12-08 ENCOUNTER — Encounter: Payer: Self-pay | Admitting: Physician Assistant

## 2012-12-08 VITALS — BP 110/72 | HR 78 | Temp 99.5°F | Resp 18 | Ht 69.0 in | Wt 192.0 lb

## 2012-12-08 DIAGNOSIS — F411 Generalized anxiety disorder: Secondary | ICD-10-CM

## 2012-12-08 DIAGNOSIS — F17200 Nicotine dependence, unspecified, uncomplicated: Secondary | ICD-10-CM

## 2012-12-08 DIAGNOSIS — Z23 Encounter for immunization: Secondary | ICD-10-CM

## 2012-12-08 DIAGNOSIS — K219 Gastro-esophageal reflux disease without esophagitis: Secondary | ICD-10-CM

## 2012-12-08 DIAGNOSIS — M549 Dorsalgia, unspecified: Secondary | ICD-10-CM

## 2012-12-08 DIAGNOSIS — G4700 Insomnia, unspecified: Secondary | ICD-10-CM

## 2012-12-08 DIAGNOSIS — F1010 Alcohol abuse, uncomplicated: Secondary | ICD-10-CM

## 2012-12-08 MED ORDER — SERTRALINE HCL 100 MG PO TABS: 100.0000 mg | ORAL_TABLET | Freq: Every day | ORAL | Status: DC

## 2012-12-08 MED ORDER — CLONAZEPAM 0.5 MG PO TABS: 0.5000 mg | ORAL_TABLET | Freq: Every day | ORAL | Status: DC

## 2012-12-08 MED ORDER — CARISOPRODOL 350 MG PO TABS: 350.0000 mg | ORAL_TABLET | Freq: Four times a day (QID) | ORAL | Status: DC | PRN

## 2012-12-08 MED ORDER — BUPROPION HCL ER (SR) 150 MG PO TB12: 150.0000 mg | ORAL_TABLET | Freq: Two times a day (BID) | ORAL | Status: DC

## 2012-12-08 MED ORDER — OMEPRAZOLE 40 MG PO CPDR: 40.0000 mg | DELAYED_RELEASE_CAPSULE | Freq: Every day | ORAL | Status: DC

## 2012-12-08 NOTE — Progress Notes (Signed)
Patient ID: Theodore Gray MRN: [id number], DOB: [birth year], 42 y.o. Date of Encounter: @DATE @  Chief Complaint:  Chief Complaint  Patient presents with  . Medication Refill  . Med no help for RLS  . Trouble sleeping  . Flu Vaccine    HPI: 42 y.o. year old white male  presents with his girlfriend for followup office visit.  Anxiety: He has seen me regarding complaints of anxiety for multiple visits now. His first visit regarding this was 02/23/12. At that visit I prescribed Zoloft 50 mg. When he had followup visit on 04/13/12 he reported no benefit  the Zoloft. However he then returned for followup on 04/30/12 with his girlfriend and they both reported that actually he was much improved with the Zoloft 50. He said he guessed he did not realize how much better he-wise with the medication until he stopped it. However after stopping the medication he noticed that he was definitely feeling much worse. He stated that actually when he was on Zoloft his coworkers even commented on how much better he was. This will girlfriend stated that she noticed that it did help. Even the patient was then realizing that he felt much much worse and more anxious since off of the mid Zoloft. Therefore at the visit on 05/01/12 Zoloft was restarted at 50 mg. He had followup visits 07/14/12. Said the Zoloft 50 mg was helping and he felt that he had more patients. However he still was not feeling normal. He is still having problems with his mind racing. At that visit we increased his Zoloft to 100 mg. Today patient states that anytime he has to sit still or be in a still position for a period of time he begins to feel panicked. He says that this has happened in stores if he is stuck in a really long line he begins to fill a panic feeling. As well even with something like watching TV he can only sit and watch TV for up to one hour and then he has to get up and find something to do. He is still having difficulty with insomnia.  He says at night his mind races. He does eat reteplase his work in his mind over and over. Mini 9C" into the counts in the middle of the night. Girlfriend reports that even when he is drifting off to sleep and often his legs and arms will jerk/jump.  He says that the medication for restless leg syndrome did not help. He has stopped this. He says that he saw a specialist for possible carpal tunnel syndrome. Says he saw Dr. Chilton Si as per orthopedics. He says that that doctor told him that he felt he had possible nerve damage to his neck and referred him to neurology for nerve conduction studies. However he has not yet followed up there.  As well he is requesting a refill on soma. He says it in the past that helped tremendously. He said he would take 1 during his lunch break and it was great. 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 Because no drowsiness or adverse effects.  He is currently smoking between one to one and a half packs per day. Was smoking 2-3 packs per day in the past.  He has stayed off of alcohol. ( When I saw him for initial visit on 05/28/11 he was drinking a 12 pack of beer per week.  Past Medical History  Diagnosis Date  . GERD (gastroesophageal reflux disease)   . Substance abuse     ALCOHOL  . Anxiety   . Insomnia      Home Meds: See attached medication section for current medication list. Any medications entered into computer today will not appear on this note's list. The medications listed below were entered prior to today. No current outpatient prescriptions on file prior to visit.   No current facility-administered medications on file prior to visit.    Allergies:  Allergies  Allergen Reactions  . Penicillins Rash    History   Social History  . Marital Status: Single    Spouse Name: N/A    Number of Children: N/A  . Years of Education: N/A   Occupational  History  . Not on file.   Social History Main Topics  . Smoking status: Current Every Day Smoker -- 1.50 packs/day    Types: Cigarettes  . Smokeless tobacco: Never Used  . Alcohol Use: Yes     Comment: Socail  . Drug Use: No  . Sexual Activity: Not on file   Other Topics Concern  . Not on file   Social History Narrative  . No narrative on file    No family history on file.   Review of Systems:  See HPI for pertinent ROS. All other ROS negative.    Physical Exam: Blood pressure 110/72, pulse 78, temperature 99.5 F (37.5 C), resp. rate 18, height 5\' 9"  (1.753 m), weight 192 lb (87.091 kg)., Body mass index is 28.34 kg/(m^2). General: WNWD WM. Appears in no acute distress.  Neck: Supple. No thyromegaly. No lymphadenopathy. Lungs: Clear bilaterally to auscultation without wheezes, rales, or rhonchi. Breathing is unlabored. Heart: RRR with S1 S2. No murmurs, rubs, or gallops. Abdomen: Soft, non-tender, non-distended with normoactive bowel sounds. No hepatomegaly. No rebound/guarding. No obvious abdominal masses. Musculoskeletal:  Strength and tone normal for age. Extremities/Skin: Warm and dry. No clubbing or cyanosis. No edema. No rashes or suspicious lesions. Neuro: Alert and oriented X 3. Moves all extremities spontaneously. Gait is normal. CNII-XII grossly in tact. Psych:  Responds to questions appropriately with a normal affect.     ASSESSMENT AND PLAN:  42 y.o. year old male with  1. GAD (generalized anxiety disorder) We'll continue Zoloft at 100 mg. Will add Wellbutrin to see if this will give him some further benefit. We'll add Klonopin to use at night. I instructed patient to take the Wellbutrin one daily for for first 5 days then increase to twice a day dose. - sertraline (ZOLOFT) 100 MG tablet; Take 1 tablet (100 mg total) by mouth daily.  Dispense: 30 tablet; Refill: 5 - buPROPion (WELLBUTRIN SR) 150 MG 12 hr tablet; Take 1 tablet (150 mg total) by mouth 2 (two)  times daily.  Dispense: 60 tablet; Refill: 2 - clonazePAM (KLONOPIN) 0.5 MG tablet; Take 1 tablet (0.5 mg total) by mouth at bedtime.  Dispense: 30 tablet; Refill: 1  2. Insomnia - clonazePAM (KLONOPIN) 0.5 MG tablet; Take 1 tablet (0.5 mg total) by mouth at bedtime.  Dispense: 30 tablet; Refill: 1  3. GERD (gastroesophageal reflux disease)  4. Smoker Hopefully the Wellbutrin will help with this. I have told the patient to take the Wellbutrin one daily for the first 5 days then increase to 1 twice a day dose. - buPROPion (WELLBUTRIN SR) 150 MG 12 hr tablet; Take 1 tablet (150 mg total) by mouth 2 (two) times daily.  Dispense:  60 tablet; Refill: 2  5. Alcohol abuse Congratulations on staying off of alcohol.  6. Back pain Refill his soma. He will just take one per day. - carisoprodol (SOMA) 350 MG tablet; Take 1 tablet (350 mg total) by mouth 4 (four) times daily as needed for muscle spasms.  Dispense: 30 tablet; Refill: 2  7. Need for prophylactic vaccination and inoculation against influenza - Flu Vaccine QUAD 36+ mos IM  I encouraged him to schedule followup with neurology for the nerve conduction studies as well as a sleep study. He is agreeable to do so. I will see him back for followup office visit in approximately 6 weeks. Follow up sooner if needed.  [address] [place], [place], [facility] 12/08/2012 1:39 PM

## 2013-01-19 ENCOUNTER — Encounter: Payer: Self-pay | Admitting: Physician Assistant

## 2013-01-19 ENCOUNTER — Ambulatory Visit (INDEPENDENT_AMBULATORY_CARE_PROVIDER_SITE_OTHER): Payer: BC Managed Care – PPO | Admitting: Physician Assistant

## 2013-01-19 VITALS — BP 114/80 | HR 68 | Temp 97.7°F | Resp 18 | Ht 69.5 in | Wt 198.0 lb

## 2013-01-19 DIAGNOSIS — F17200 Nicotine dependence, unspecified, uncomplicated: Secondary | ICD-10-CM

## 2013-01-19 DIAGNOSIS — K219 Gastro-esophageal reflux disease without esophagitis: Secondary | ICD-10-CM

## 2013-01-19 DIAGNOSIS — F411 Generalized anxiety disorder: Secondary | ICD-10-CM

## 2013-01-19 DIAGNOSIS — G4700 Insomnia, unspecified: Secondary | ICD-10-CM

## 2013-01-19 DIAGNOSIS — M549 Dorsalgia, unspecified: Secondary | ICD-10-CM

## 2013-01-19 DIAGNOSIS — F1010 Alcohol abuse, uncomplicated: Secondary | ICD-10-CM

## 2013-01-19 MED ORDER — SERTRALINE HCL 100 MG PO TABS: 100.0000 mg | ORAL_TABLET | Freq: Every day | ORAL | Status: DC

## 2013-01-19 MED ORDER — CLONAZEPAM 1 MG PO TABS: 1.0000 mg | ORAL_TABLET | Freq: Every day | ORAL | Status: DC

## 2013-01-19 MED ORDER — CARISOPRODOL 350 MG PO TABS: 350.0000 mg | ORAL_TABLET | Freq: Two times a day (BID) | ORAL | Status: DC | PRN

## 2013-01-19 MED ORDER — BUPROPION HCL ER (SR) 150 MG PO TB12: 150.0000 mg | ORAL_TABLET | Freq: Two times a day (BID) | ORAL | Status: DC

## 2013-01-19 MED ORDER — OMEPRAZOLE 40 MG PO CPDR: 40.0000 mg | DELAYED_RELEASE_CAPSULE | Freq: Every day | ORAL | Status: DC

## 2013-01-19 NOTE — Progress Notes (Signed)
Patient ID: WM SAHAGUN MRN: [id number], DOB: 10/25/1970, 42 y.o. Date of Encounter: @DATE @  Chief Complaint:  Chief Complaint  Patient presents with  . 6 wk follow up    HPI: 42 y.o. year old white male  presents for followup.  His last office visit with me was 12/08/12. At that time he was on Zoloft 100 mg. He and his girl friend reported that the Zoloft 100 mg definitely was  helping with his anxiety. However, even on this medication, he reported that anytime he had to sit still or be in one position for a period of time he began to feel panicked. He said this would happen when he was in stores stuck in a long line. Also even with something like watching TV he can only sit still to watch TV for about one hour and would have to get up and find something to do. As well he was still having difficulty with insomnia. He reported that at night his mind raced. He would replay things from work in his mind over and over. As well, his girlfriend reported that often when he was drifting off to sleep his legs and arms would jerk/jump.  Also, he has been working on smoking cessation. In the past he had been smoking 2-3 packs per day. At his last visit with me 12/08/12 he was smoking between one to one and a half packs per day.  At that visit 12/08/12 we added Wellbutrin to help with the anxiety and smoking cessation. Today he reports that he is taking the Wellbutrin in addition to the Zoloft. He is tolerating this well with no adverse effects. As well he says that he does think that the Wellbutrin is helping. He definitely has noticed a decrease in craving for cigarettes. He is down to 2 or 3 cigarettes per day. He is also using E cigarette. He says that he fills that once a day. He says he has decreased strength. It was at 16 -now down to a 10 and he is also using it less frequently.  As well he does think the anxiety and panic are improved with the addition of the Wellbutrin.Marland Kitchen  He says that as  long as he takes the soma in the morning and again when he gets home from work at 5 PM, he has not been having any more of the muscle twitches/jerking. 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  He says that at night if he takes 2 Klonopin, that this works perfectly for the insomnia. He says that when he is just taking one Klonopin he was having no effect. However when he takes 2 Klonopin it works perfectly. He is able to fall asleep and stay asleep all night but has no problems with drowsiness the following morning.   Past Medical History  Diagnosis Date  . GERD (gastroesophageal reflux disease)   . Substance abuse     ALCOHOL  . Anxiety   . Insomnia      Home Meds: See attached medication section for current medication list. Any medications entered into computer today will not appear on this note's list. The medications listed below were entered prior to today. No current outpatient prescriptions on file prior to visit.   No current facility-administered medications on file prior to visit.    Allergies:  Allergies  Allergen  Reactions  . Penicillins Rash    History   Social History  . Marital Status: Single    Spouse Name: N/A    Number of Children: N/A  . Years of Education: N/A   Occupational History  . Not on file.   Social History Main Topics  . Smoking status: Current Every Day Smoker -- 1.50 packs/day    Types: Cigarettes  . Smokeless tobacco: Never Used  . Alcohol Use: Yes     Comment: Socail  . Drug Use: No  . Sexual Activity: Not on file   Other Topics Concern  . Not on file   Social History Narrative  . No narrative on file    No family history on file.   Review of Systems:  See HPI for pertinent ROS. All other ROS negative.    Physical Exam: Blood pressure 114/80, pulse 68, temperature 97.7 F (36.5 C), temperature source Oral,  resp. rate 18, height 5' 9.5" (1.765 m), weight 198 lb (89.812 kg)., Body mass index is 28.83 kg/(m^2). General: Appears in no acute distress. Head: Normocephalic, atraumatic, eyes without discharge, sclera non-icteric, nares are without discharge. Bilateral auditory canals clear, TM's are without perforation, pearly grey and translucent with reflective cone of light bilaterally. Oral cavity moist, posterior pharynx without exudate, erythema, peritonsillar abscess, or post nasal drip.  Neck: Supple. No thyromegaly. No lymphadenopathy. Lungs: Clear bilaterally to auscultation without wheezes, rales, or rhonchi. Breathing is unlabored. Heart: RRR with S1 S2. No murmurs, rubs, or gallops. Abdomen: Soft, non-tender, non-distended with normoactive bowel sounds. No hepatomegaly. No rebound/guarding. No obvious abdominal masses. Musculoskeletal:  Strength and tone normal for age. Extremities/Skin: Warm and dry. No clubbing or cyanosis. No edema. No rashes or suspicious lesions. Neuro: Alert and oriented X 3. Moves all extremities spontaneously. Gait is normal. CNII-XII grossly in tact. Psych:  Responds to questions appropriately with a normal affect.     ASSESSMENT AND PLAN:  42 y.o. year old male with  1. GAD (generalized anxiety disorder) We'll continue Zoloft 100 mg daily. We'll continue the Wellbutrin SR 150 mg twice a day. Will stop Klonopin 0.5 mg dose which she is having to take 2 of each night. We'll change this to the 1 mg tablet so he can take 1 each bedtime. - sertraline (ZOLOFT) 100 MG tablet; Take 1 tablet (100 mg total) by mouth daily.  Dispense: 30 tablet; Refill: 5 - clonazePAM (KLONOPIN) 1 MG tablet; Take 1 tablet (1 mg total) by mouth at bedtime.  Dispense: 30 tablet; Refill: 0 - clonazePAM (KLONOPIN) 1 MG tablet; Take 1 tablet (1 mg total) by mouth at bedtime.  Dispense: 30 tablet; Refill: 0 - clonazePAM (KLONOPIN) 1 MG tablet; Take 1 tablet (1 mg total) by mouth at bedtime.   Dispense: 30 tablet; Refill: 0 - buPROPion (WELLBUTRIN SR) 150 MG 12 hr tablet; Take 1 tablet (150 mg total) by mouth 2 (two) times daily.  Dispense: 60 tablet; Refill: 5  ONE OF THE KLONOPIN  RXES CAN BE FILLED NOW.  oN ONE, I WROTE " DO NOT FILL UNTIL 02/19/13 ON ONE I WROTE " DON NOT FILL UNTIL 2/31/15.  2. Insomnia - clonazePAM (KLONOPIN) 1 MG tablet; Take 1 tablet (1 mg total) by mouth at bedtime.  Dispense: 30 tablet; Refill: 0 - clonazePAM (KLONOPIN) 1 MG tablet; Take 1 tablet (1 mg total) by mouth at bedtime.  Dispense: 30 tablet; Refill: 0 - clonazePAM (KLONOPIN) 1 MG tablet; Take 1 tablet (1 mg total) by mouth  at bedtime.  Dispense: 30 tablet; Refill: 0  3. Smoker We'll continue the Wellbutrin. He can continue to use cigarettes to help him wean off of the nicotine. And to wean off of the oral habits. - buPROPion (WELLBUTRIN SR) 150 MG 12 hr tablet; Take 1 tablet (150 mg total) by mouth 2 (two) times daily.  Dispense: 60 tablet; Refill: 5  4. GERD (gastroesophageal reflux disease) - omeprazole (PRILOSEC) 40 MG capsule; Take 1 capsule (40 mg total) by mouth daily.  Dispense: 30 capsule; Refill: 11  5.H/O Alcohol abuse When I Initially saw him he was drinking considerable amount of alcohol. Since then he has completely stopped alcohol. He says that he may occasionally have some alcohol socially on Wednesday after the weekend but that is all.   6. Back pain - carisoprodol (SOMA) 350 MG tablet; Take 1 tablet (350 mg total) by mouth 2 (two) times daily as needed for muscle spasms.  Dispense: 60 tablet; Refill: 2  If everything remains stable he can wait 6 months for followup office visit. Follow up sooner if needed. Murray Hodgkins [place], [place], [facility] 01/19/2013 2:48 PM

## 2013-01-26 ENCOUNTER — Encounter: Payer: Self-pay | Admitting: Physician Assistant

## 2013-01-26 ENCOUNTER — Ambulatory Visit (INDEPENDENT_AMBULATORY_CARE_PROVIDER_SITE_OTHER): Payer: BC Managed Care – PPO | Admitting: Physician Assistant

## 2013-01-26 VITALS — BP 134/86 | HR 64 | Temp 98.3°F | Resp 18 | Wt 200.0 lb

## 2013-01-26 DIAGNOSIS — B079 Viral wart, unspecified: Secondary | ICD-10-CM

## 2013-01-26 DIAGNOSIS — B078 Other viral warts: Secondary | ICD-10-CM

## 2013-01-26 NOTE — Progress Notes (Signed)
    Patient ID: Theodore Gray MRN: [id number], DOB: 09/05/1970, 43 y.o. Date of Encounter: 01/26/2013, 8:57 AM    Chief Complaint:  Chief Complaint  Patient presents with  . freeze wart on left hand     HPI: 43 y.o. year oldwhite  male was recently here for regular office visit. At that time he showed me a wart on his left hand. At that time he had multiple other complaints to address, so we did not have time to do cryotherapy to this wart. He was asked to return to clinic for this. He presents today for the cryotherapy. He reports that this wart has been present for many years. He says that he has even "cut it off " himself multiple times but it just keeps coming back. Has had no other treatment to this wart.     Home Meds: See attached medication section for any medications that were entered at today's visit. The computer does not put those onto this list.The following list is a list of meds entered prior to today's visit.   Current Outpatient Prescriptions on File Prior to Visit  Medication Sig Dispense Refill  . buPROPion (WELLBUTRIN SR) 150 MG 12 hr tablet Take 1 tablet (150 mg total) by mouth 2 (two) times daily.  60 tablet  5  . carisoprodol (SOMA) 350 MG tablet Take 1 tablet (350 mg total) by mouth 2 (two) times daily as needed for muscle spasms.  60 tablet  2  . clonazePAM (KLONOPIN) 1 MG tablet Take 1 tablet (1 mg total) by mouth at bedtime.  30 tablet  0  . omeprazole (PRILOSEC) 40 MG capsule Take 1 capsule (40 mg total) by mouth daily.  30 capsule  11  . sertraline (ZOLOFT) 100 MG tablet Take 1 tablet (100 mg total) by mouth daily.  30 tablet  5   No current facility-administered medications on file prior to visit.    Allergies:  Allergies  Allergen Reactions  . Penicillins Rash      Review of Systems: See HPI for pertinent ROS. All other ROS negative.    Physical Exam: Blood pressure 134/86, pulse 64, temperature 98.3 F (36.8 C), temperature source Oral, resp.  rate 18, weight 200 lb (90.719 kg)., Body mass index is 29.12 kg/(m^2). General: WNWD WM. Appears in no acute distress. Lungs: Clear bilaterally to auscultation without wheezes, rales, or rhonchi. Breathing is unlabored. Heart: Regular rhythm. No murmurs, rubs, or gallops. Msk:  Strength and tone normal for age. Extremities/Skin: Left hand, Palmar Surface: There is a verrucae which is approx 0.75 cm diameter and is raised approx 5 mm.  Neuro: Alert and oriented X 3. Moves all extremities spontaneously. Gait is normal. CNII-XII grossly in tact. Psych:  Responds to questions appropriately with a normal affect.     ASSESSMENT AND PLAN:  43 y.o. 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 Scalpel was then used to debride the site.  Cryotherapy was then performed for 4 freeze thaw cycles. I discussed with the patient that this is caused by a virus and that if we did not completely get rid of this, then I will continue to return. I discussed that it will take multiple treatments to completely irradicate this. He voices understanding and agrees. Followup for repeat cryotherapy in 2 weeks.   Signed, [address], [place], [facility] 01/26/2013 8:57 AM

## 2013-02-09 ENCOUNTER — Ambulatory Visit: Payer: Self-pay | Admitting: Physician Assistant

## 2013-02-14 ENCOUNTER — Ambulatory Visit: Payer: Self-pay | Admitting: Physician Assistant

## 2013-02-17 ENCOUNTER — Ambulatory Visit (INDEPENDENT_AMBULATORY_CARE_PROVIDER_SITE_OTHER): Payer: BC Managed Care – PPO | Admitting: Physician Assistant

## 2013-02-17 ENCOUNTER — Encounter: Payer: Self-pay | Admitting: Physician Assistant

## 2013-02-17 DIAGNOSIS — B078 Other viral warts: Secondary | ICD-10-CM

## 2013-02-17 DIAGNOSIS — B079 Viral wart, unspecified: Secondary | ICD-10-CM

## 2013-02-17 NOTE — Progress Notes (Signed)
    Patient ID: Theodore Gray MRN: [id number], DOB: 04/09/1970, 43 y.o. Date of Encounter: 02/17/2013, 10:35 AM    Chief Complaint: No chief complaint on file.    HPI: [age] y.o. year old male here for repeat cryotherapy to the wart on his hand.  He had a regular office visit with me in December. At that time he showed me a wart on his hand. I told him it needed cryotherapy but at that visit we had too many other complaints to address so I had him schedule a followup appointment.  He came here on 01/26/13. At that visit I debrided the wart and applied cryotherapy x4 freeze-thaw cycles. Today he states that the size of the wart has decreased since the prior treatment.     Home Meds: See attached medication section for any medications that were entered at today's visit. The computer does not put those onto this list.The following list is a list of meds entered prior to today's visit.   Current Outpatient Prescriptions on File Prior to Visit  Medication Sig Dispense Refill  . buPROPion (WELLBUTRIN SR) 150 MG 12 hr tablet Take 1 tablet (150 mg total) by mouth 2 (two) times daily.  60 tablet  5  . carisoprodol (SOMA) 350 MG tablet Take 1 tablet (350 mg total) by mouth 2 (two) times daily as needed for muscle spasms.  60 tablet  2  . clonazePAM (KLONOPIN) 1 MG tablet Take 1 tablet (1 mg total) by mouth at bedtime.  30 tablet  0  . omeprazole (PRILOSEC) 40 MG capsule Take 1 capsule (40 mg total) by mouth daily.  30 capsule  11  . sertraline (ZOLOFT) 100 MG tablet Take 1 tablet (100 mg total) by mouth daily.  30 tablet  5   No current facility-administered medications on file prior to visit.    Allergies:  Allergies  Allergen Reactions  . Penicillins Rash      Review of Systems: See HPI for pertinent ROS. All other ROS negative.    Physical Exam: There were no vitals taken for this visit., There is no weight on file to calculate BMI. General: WNWD WM.  Appears in no acute  distress. Lungs: Clear bilaterally to auscultation without wheezes, rales, or rhonchi. Breathing is unlabored. Heart: Regular rhythm. No murmurs, rubs, or gallops. Msk:  Strength and tone normal for age. Extremities/Skin:  Left hand, palmar surface: There is a verrucae, which is approximate 0.5 cm diameter and is raised approximately 3 mm. Neuro: Alert and oriented X 3. Moves all extremities spontaneously. Gait is normal. CNII-XII grossly in tact. Psych:  Responds to questions appropriately with a normal affect.     ASSESSMENT AND PLAN:  43 y.o. year old male with  1. Verrucae vulgaris The okay has responded well to his last treatment. The size has diminished. Site cleansed with alcohol swab. Repeat cryotherapy applied today x 4 freeze thaw cycles. Return in 2 weeks for repeat cryotherapy.   Signed, [address]Mary Beth Belle TerreDixon, [place], [facility] 02/17/2013 10:35 AM

## 2013-03-03 ENCOUNTER — Encounter: Payer: Self-pay | Admitting: Physician Assistant

## 2013-03-03 ENCOUNTER — Ambulatory Visit (INDEPENDENT_AMBULATORY_CARE_PROVIDER_SITE_OTHER): Payer: BC Managed Care – PPO | Admitting: Physician Assistant

## 2013-03-03 DIAGNOSIS — B078 Other viral warts: Secondary | ICD-10-CM

## 2013-03-03 DIAGNOSIS — B079 Viral wart, unspecified: Secondary | ICD-10-CM

## 2013-03-03 NOTE — Progress Notes (Signed)
    Patient ID: Theodore Gray MRN: [id number], DOB: 11/12/1970, 43 y.o. Date of Encounter: 03/03/2013, 5:01 PM    Chief Complaint:  Chief Complaint  Patient presents with  . repeat cryo treatment    warts on hands     HPI: 43 y.o. year old male here for repeat cryotherapy to wart on his hand.     Home Meds: See attached medication section for any medications that were entered at today's visit. The computer does not put those onto this list.The following list is a list of meds entered prior to today's visit.   Current Outpatient Prescriptions on File Prior to Visit  Medication Sig Dispense Refill  . buPROPion (WELLBUTRIN SR) 150 MG 12 hr tablet Take 1 tablet (150 mg total) by mouth 2 (two) times daily.  60 tablet  5  . carisoprodol (SOMA) 350 MG tablet Take 1 tablet (350 mg total) by mouth 2 (two) times daily as needed for muscle spasms.  60 tablet  2  . clonazePAM (KLONOPIN) 1 MG tablet Take 1 tablet (1 mg total) by mouth at bedtime.  30 tablet  0  . omeprazole (PRILOSEC) 40 MG capsule Take 1 capsule (40 mg total) by mouth daily.  30 capsule  11  . sertraline (ZOLOFT) 100 MG tablet Take 1 tablet (100 mg total) by mouth daily.  30 tablet  5   No current facility-administered medications on file prior to visit.    Allergies:  Allergies  Allergen Reactions  . Penicillins Rash      Review of Systems: See HPI for pertinent ROS. All other ROS negative.    Physical Exam: There were no vitals taken for this visit., There is no weight on file to calculate BMI. General: WNWD WM.  Appears in no acute distress. Lungs: Clear bilaterally to auscultation without wheezes, rales, or rhonchi. Breathing is unlabored. Heart: Regular rhythm. No murmurs, rubs, or gallops. Msk:  Strength and tone normal for age. Skin: Warm and dry. Left hand, palmar surface: There is a verrucae which is approximately 0.3 cm diameter and now is only raised approximately 1 mm. The center of the lesion is now  dark red/purple/black color. Neuro: Alert and oriented X 3. Moves all extremities spontaneously. Gait is normal. CNII-XII grossly in tact. Psych:  Responds to questions appropriately with a normal affect.     ASSESSMENT AND PLAN:  43 y.o. year old male with  1. Verrucae vulgaris Site was cleansed with alcohol swab. Repeat cryotherapy applied today x 4 freeze thaw cycles. Itself appears that it is resolving/dying. Told him to give it 4 weeks prior to scheduling followup. He plans to evaluated himself in about 4 weeks. If it appears completely resolved then no followup needed. However if there is any remaining wart at that time, he knows to come back in for repeat therapy.   [address]igned, Theodore Gray, [place]PA, [facility] 03/03/2013 5:01 PM

## 2013-03-17 ENCOUNTER — Other Ambulatory Visit: Payer: Self-pay | Admitting: Physician Assistant

## 2013-03-18 NOTE — Telephone Encounter (Signed)
Refill denied, dismissed patient

## 2013-04-16 ENCOUNTER — Other Ambulatory Visit: Payer: Self-pay | Admitting: Physician Assistant

## 2013-04-18 NOTE — Telephone Encounter (Signed)
Ok to refill??  Last office visit 03/11/2013.  Last refill 01/19/2013.

## 2013-04-18 NOTE — Telephone Encounter (Signed)
For the Clonopin can call in #30+2 additional refills. For the soma, can call in prescription for #60+2 additional refills

## 2013-04-20 NOTE — Telephone Encounter (Signed)
Refills called in 

## 2013-07-20 ENCOUNTER — Ambulatory Visit: Payer: BC Managed Care – PPO | Admitting: Physician Assistant

## 2013-08-01 ENCOUNTER — Other Ambulatory Visit: Payer: Self-pay | Admitting: Physician Assistant

## 2013-08-01 NOTE — Telephone Encounter (Signed)
Pt has been dismissed. Find out from Rob what was the date of the dismissal--we have to treat for 30 days after  Dismissal.  If < 30 days, let me know.  If > 30 days, then no further refills from here.

## 2013-08-01 NOTE — Telephone Encounter (Signed)
Ok to refill 

## 2013-08-03 NOTE — Telephone Encounter (Signed)
His last ROV was 01/19/13. Was to have f/u OV 6 months. Due for OV. Needs to pay balance so can have OV!!!

## 2013-08-03 NOTE — Telephone Encounter (Signed)
It doesn't look like he has been dismissed from the practice yet. The only note (07/26/2013) states not to schedule him until he has paid his balance.

## 2013-08-03 NOTE — Telephone Encounter (Signed)
Letter sent.   Refills denied.

## 2013-08-05 ENCOUNTER — Other Ambulatory Visit: Payer: Self-pay | Admitting: Physician Assistant

## 2013-08-09 ENCOUNTER — Telehealth: Payer: Self-pay | Admitting: Family Medicine

## 2013-08-09 DIAGNOSIS — F411 Generalized anxiety disorder: Secondary | ICD-10-CM

## 2013-08-09 DIAGNOSIS — F17200 Nicotine dependence, unspecified, uncomplicated: Secondary | ICD-10-CM

## 2013-08-09 MED ORDER — SERTRALINE HCL 100 MG PO TABS: 100.0000 mg | ORAL_TABLET | Freq: Every day | ORAL | Status: AC

## 2013-08-09 MED ORDER — BUPROPION HCL ER (SR) 150 MG PO TB12: 150.0000 mg | ORAL_TABLET | Freq: Two times a day (BID) | ORAL | Status: AC

## 2013-08-09 MED ORDER — OMEPRAZOLE 40 MG PO CPDR: 40.0000 mg | DELAYED_RELEASE_CAPSULE | Freq: Every day | ORAL | Status: AC

## 2013-08-09 MED ORDER — CARISOPRODOL 350 MG PO TABS: 350.0000 mg | ORAL_TABLET | Freq: Two times a day (BID) | ORAL | Status: DC | PRN

## 2013-08-09 NOTE — Telephone Encounter (Signed)
Pt in between insurances from old job to new job.   New Insurance startes September.  Know has $25 balance.  Needs med refills until can make appt for Sept. Needs Zoloft, Wellbutrin, Soma and Prilosec  One month supply sent on 4 Rx's.  Pt needs to clear balance before any further refills.  Needs to schedule appt for Sept when new insurance coverage starts.

## 2013-08-10 ENCOUNTER — Other Ambulatory Visit: Payer: Self-pay | Admitting: Physician Assistant

## 2013-08-10 NOTE — Telephone Encounter (Signed)
Selena BattenKim,  I think this looks familiar. I think I may have already addressed this in another section of [employer].  Can you investigate and let me know. I remember seeing some other note about him-- - - something to do with him changing insurances or changing jobs or something. - - - ? ?  ?

## 2013-08-10 NOTE — Telephone Encounter (Signed)
Ok to refill Klonopin??  Last office visit 03/03/2013.  Last refill 04/21/2013.

## 2013-08-10 NOTE — Telephone Encounter (Signed)
Agree 

## 2013-09-23 ENCOUNTER — Ambulatory Visit: Payer: Self-pay | Admitting: Family Medicine

## 2013-09-27 ENCOUNTER — Ambulatory Visit: Payer: Self-pay | Admitting: Family Medicine

## 2013-12-06 ENCOUNTER — Emergency Department (HOSPITAL_COMMUNITY)
Admission: EM | Admit: 2013-12-06 | Discharge: 2013-12-06 | Disposition: A | Discharge status: Home / Self Care | Payer: BC Managed Care – PPO | Attending: Emergency Medicine | Admitting: Emergency Medicine

## 2013-12-06 ENCOUNTER — Encounter (HOSPITAL_COMMUNITY): Payer: Self-pay | Admitting: Emergency Medicine

## 2013-12-06 ENCOUNTER — Emergency Department (HOSPITAL_COMMUNITY): Payer: BC Managed Care – PPO

## 2013-12-06 DIAGNOSIS — K219 Gastro-esophageal reflux disease without esophagitis: Secondary | ICD-10-CM | POA: Diagnosis not present

## 2013-12-06 DIAGNOSIS — Z720 Tobacco use: Secondary | ICD-10-CM | POA: Diagnosis not present

## 2013-12-06 DIAGNOSIS — Y9389 Activity, other specified: Secondary | ICD-10-CM | POA: Insufficient documentation

## 2013-12-06 DIAGNOSIS — Z8669 Personal history of other diseases of the nervous system and sense organs: Secondary | ICD-10-CM | POA: Insufficient documentation

## 2013-12-06 DIAGNOSIS — Y929 Unspecified place or not applicable: Secondary | ICD-10-CM | POA: Diagnosis not present

## 2013-12-06 DIAGNOSIS — Z79899 Other long term (current) drug therapy: Secondary | ICD-10-CM | POA: Diagnosis not present

## 2013-12-06 DIAGNOSIS — F419 Anxiety disorder, unspecified: Secondary | ICD-10-CM | POA: Insufficient documentation

## 2013-12-06 DIAGNOSIS — W25XXXA Contact with sharp glass, initial encounter: Secondary | ICD-10-CM | POA: Insufficient documentation

## 2013-12-06 DIAGNOSIS — T1490XA Injury, unspecified, initial encounter: Secondary | ICD-10-CM

## 2013-12-06 DIAGNOSIS — S61411A Laceration without foreign body of right hand, initial encounter: Secondary | ICD-10-CM | POA: Diagnosis present

## 2013-12-06 DIAGNOSIS — Y998 Other external cause status: Secondary | ICD-10-CM | POA: Diagnosis not present

## 2013-12-06 MED ORDER — LIDOCAINE HCL (PF) 2 % IJ SOLN
INTRAMUSCULAR | Status: AC
  Administered 2013-12-06: 12:00:00
  Filled 2013-12-06: qty 10

## 2013-12-06 NOTE — ED Provider Notes (Signed)
CSN: [id number]     Arrival date & time 12/06/13  [id number] History   First MD Initiated Contact with Patient 12/06/13 1004     Chief Complaint  Patient presents with  . Extremity Laceration     (Consider location/radiation/quality/duration/timing/severity/associated sxs/prior Treatment) HPI Comments: Patient presents to the emergency department with complaint oflaceration to the right hand. Patient states that he cut his hand on a piece of broken glass this morning. He was able to control the bleeding by applying pressure. He denies being on any anticoagulation medications. He denies any bleeding disorders. His been no previous operations or procedures involving the right hand. He is not taken anything for this injury to this point.  The history is provided by the patient.    Past Medical History  Diagnosis Date  . GERD (gastroesophageal reflux disease)   . Substance abuse     ALCOHOL  . Anxiety   . Insomnia    Past Surgical History  Procedure Laterality Date  . Vasectomy     No family history on file. History  Substance Use Topics  . Smoking status: Current Every Day Smoker -- 1.50 packs/day    Types: Cigarettes  . Smokeless tobacco: Never Used  . Alcohol Use: Yes     Comment: Socail    Review of Systems  Constitutional: Negative for activity change.       All ROS Neg except as noted in HPI  Eyes: Negative for photophobia and discharge.  Respiratory: Negative for cough, shortness of breath and wheezing.   Cardiovascular: Negative for chest pain and palpitations.  Gastrointestinal: Negative for abdominal pain and blood in stool.  Genitourinary: Negative for dysuria, frequency and hematuria.  Musculoskeletal: Negative for back pain, arthralgias and neck pain.  Skin: Negative.   Neurological: Negative for dizziness, seizures and speech difficulty.  Psychiatric/Behavioral: Negative for hallucinations and confusion. The patient is nervous/anxious.       Allergies    Penicillins  Home Medications   Prior to Admission medications   Medication Sig Start Date End Date Taking? Authorizing Provider  buPROPion (WELLBUTRIN SR) 150 MG 12 hr tablet Take 1 tablet (150 mg total) by mouth 2 (two) times daily. 08/09/13   Dorena BodoMary B Dixon, PA-C  carisoprodol (SOMA) 350 MG tablet Take 1 tablet (350 mg total) by mouth 2 (two) times daily as needed for muscle spasms. 08/09/13   Patriciaann ClanMary B Dixon, PA-C  clonazePAM (KLONOPIN) 1 MG tablet TAKE 1 TABLET BY MOUTH AT BEDTIME AS NEEDED. 08/10/13   Dorena BodoMary B Dixon, PA-C  omeprazole (PRILOSEC) 40 MG capsule Take 1 capsule (40 mg total) by mouth daily. 08/09/13   Patriciaann ClanMary B Dixon, PA-C  sertraline (ZOLOFT) 100 MG tablet Take 1 tablet (100 mg total) by mouth daily. 08/09/13   Patriciaann ClanMary B Dixon, PA-C   BP 109/69 mmHg  Pulse 86  Temp(Src) 98 F (36.7 C) (Oral)  Resp 18  Ht 5\' 9"  (1.753 m)  Wt 195 lb (88.451 kg)  BMI 28.78 kg/m2  SpO2 100% Physical Exam  Constitutional: He is oriented to person, place, and time. He appears well-developed and well-nourished.  Non-toxic appearance.  HENT:  Head: Normocephalic.  Right Ear: Tympanic membrane and external ear normal.  Left Ear: Tympanic membrane and external ear normal.  Eyes: EOM and lids are normal. Pupils are equal, round, and reactive to light.  Neck: Normal range of motion. Neck supple. Carotid bruit is not present.  Cardiovascular: Normal rate, regular rhythm, normal heart sounds, intact distal pulses and  normal pulses.   Pulmonary/Chest: Breath sounds normal. No respiratory distress.  Abdominal: Soft. Bowel sounds are normal. There is no tenderness. There is no guarding.  Musculoskeletal: Normal range of motion.       Right hand: He exhibits tenderness and laceration. He exhibits normal range of motion, normal capillary refill, no deformity and no swelling. Normal sensation noted. Normal strength noted.       Hands: Lymphadenopathy:       Head (right side): No submandibular adenopathy present.        Head (left side): No submandibular adenopathy present.    He has no cervical adenopathy.  Neurological: He is alert and oriented to person, place, and time. He has normal strength. No cranial nerve deficit or sensory deficit.  Skin: Skin is warm and dry.  Psychiatric: He has a normal mood and affect. His speech is normal.  Nursing note and vitals reviewed.   ED Course  LACERATION REPAIR Date/Time: 12/06/2013 11:42 AM Performed by: Kathie DikeBRYANT, Saafir Abdullah M Authorized by: Kathie DikeBRYANT, Angie Piercey M Consent: Verbal consent obtained. Risks and benefits: risks, benefits and alternatives were discussed Consent given by: patient Patient understanding: patient states understanding of the procedure being performed Required items: required blood products, implants, devices, and special equipment available Patient identity confirmed: arm band Time out: Immediately prior to procedure a "time out" was called to verify the correct patient, procedure, equipment, support staff and site/side marked as required. Body area: upper extremity Location details: right hand Laceration length: 2.7 cm Foreign bodies: no foreign bodies Tendon involvement: none Nerve involvement: none Vascular damage: no Anesthesia: local infiltration Local anesthetic: lidocaine 1% without epinephrine Patient sedated: no Preparation: Patient was prepped and draped in the usual sterile fashion. Irrigation solution: saline Amount of cleaning: standard Skin closure: 4-0 Prolene Number of sutures: 5 Technique: simple Approximation: close Approximation difficulty: simple Dressing: 4x4 sterile gauze Patient tolerance: Patient tolerated the procedure well with no immediate complications   (including critical care time) Labs Review Labs Reviewed - No data to display  Imaging Review No results found.   EKG Interpretation None      MDM  Vital signs are well within normal limits. There is no sign of foreign body. There is no  bone or tendon involvement. Patient is advised to have the sutures removed in 7 days. He is also given instructions to return if any signs of infection.   Final diagnoses:  Injury  Laceration of hand, right, initial encounter    **I have reviewed nursing notes, vital signs, and all appropriate lab and imaging results for this patient.    Kathie DikeHobson M Evann Koelzer, PA-C 12/06/13 1205  Joya Gaskinsonald W Wickline, MD 12/07/13 1247

## 2013-12-06 NOTE — Discharge Instructions (Signed)
Please keep the wound clean and dry.please have sutures removed in 7 days. Please see your primary physician, or return to the emergency department if any signs of infection. Sutured Wound Care Sutures are stitches that can be used to close wounds. Wound care helps prevent pain and infection.  HOME CARE INSTRUCTIONS   Rest and elevate the injured area until all the pain and swelling are gone.  Only take over-the-counter or prescription medicines for pain, discomfort, or fever as directed by your caregiver.  After 48 hours, gently wash the area with mild soap and water once a day, or as directed. Rinse off the soap. Pat the area dry with a clean towel. Do not rub the wound. This may cause bleeding.  Follow your caregiver's instructions for how often to change the bandage (dressing). Stop using a dressing after 2 days or after the wound stops draining.  If the dressing sticks, moisten it with soapy water and gently remove it.  Apply ointment on the wound as directed.  Avoid stretching a sutured wound.  Drink enough fluids to keep your urine clear or pale yellow.  Follow up with your caregiver for suture removal as directed.  Use sunscreen on your wound for the next 3 to 6 months so the scar will not darken. SEEK IMMEDIATE MEDICAL CARE IF:   Your wound becomes red, swollen, hot, or tender.  You have increasing pain in the wound.  You have a red streak that extends from the wound.  There is pus coming from the wound.  You have a fever.  You have shaking chills.  There is a bad smell coming from the wound.  You have persistent bleeding from the wound. MAKE SURE YOU:   Understand these instructions.  Will watch your condition.  Will get help right away if you are not doing well or get worse. Document Released: 02/14/2004 Document Revised: 03/31/2011 Document Reviewed: 05/12/2010 [facility] Patient Information 2015 [employer], [place]. This information is not intended to replace  advice given to you by your health care provider. Make sure you discuss any questions you have with your health care provider.

## 2013-12-06 NOTE — ED Notes (Signed)
Laceration noted to right hand from broken glass this am. Bleeding controlled at this time and new bandage applied.

## 2013-12-06 NOTE — ED Notes (Signed)
Dressing applied to R hand.

## 2014-02-14 ENCOUNTER — Encounter: Payer: Self-pay | Admitting: Physician Assistant

## 2014-04-03 ENCOUNTER — Encounter: Payer: Self-pay | Admitting: Physician Assistant

## 2014-05-11 ENCOUNTER — Encounter: Payer: Self-pay | Admitting: Physician Assistant

## 2014-11-03 IMAGING — CR DG THORACIC SPINE 3V
3 series · 3 of 3 positions shown · non-contrast
Comparison: None.

CLINICAL DATA: Left upper back pain, no injury

THORACIC SPINE - 2 VIEW + SWIMMERS

[t t-spine a.p.]
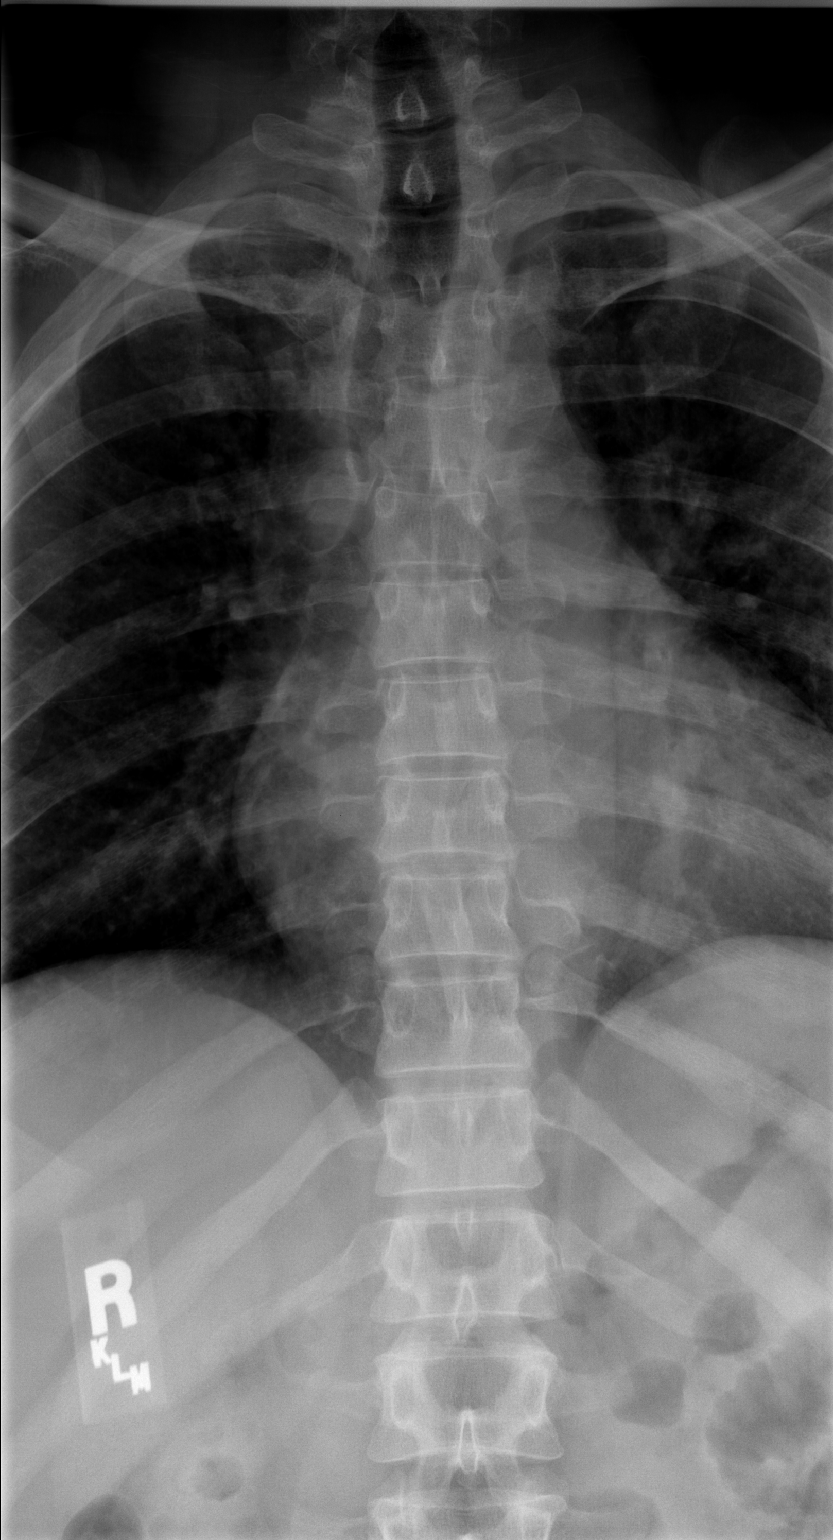

[t t-spine lat]
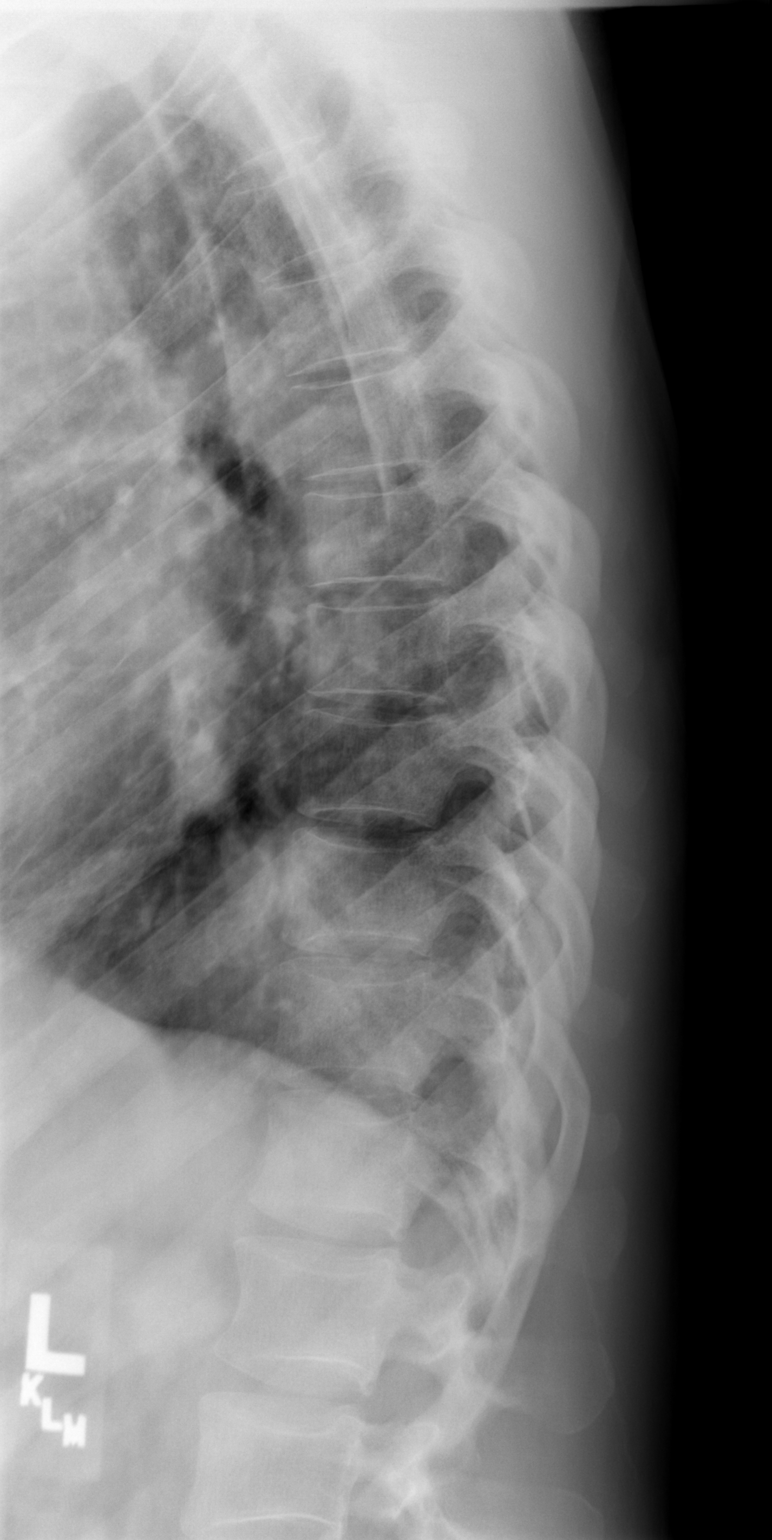

[t swimmers]
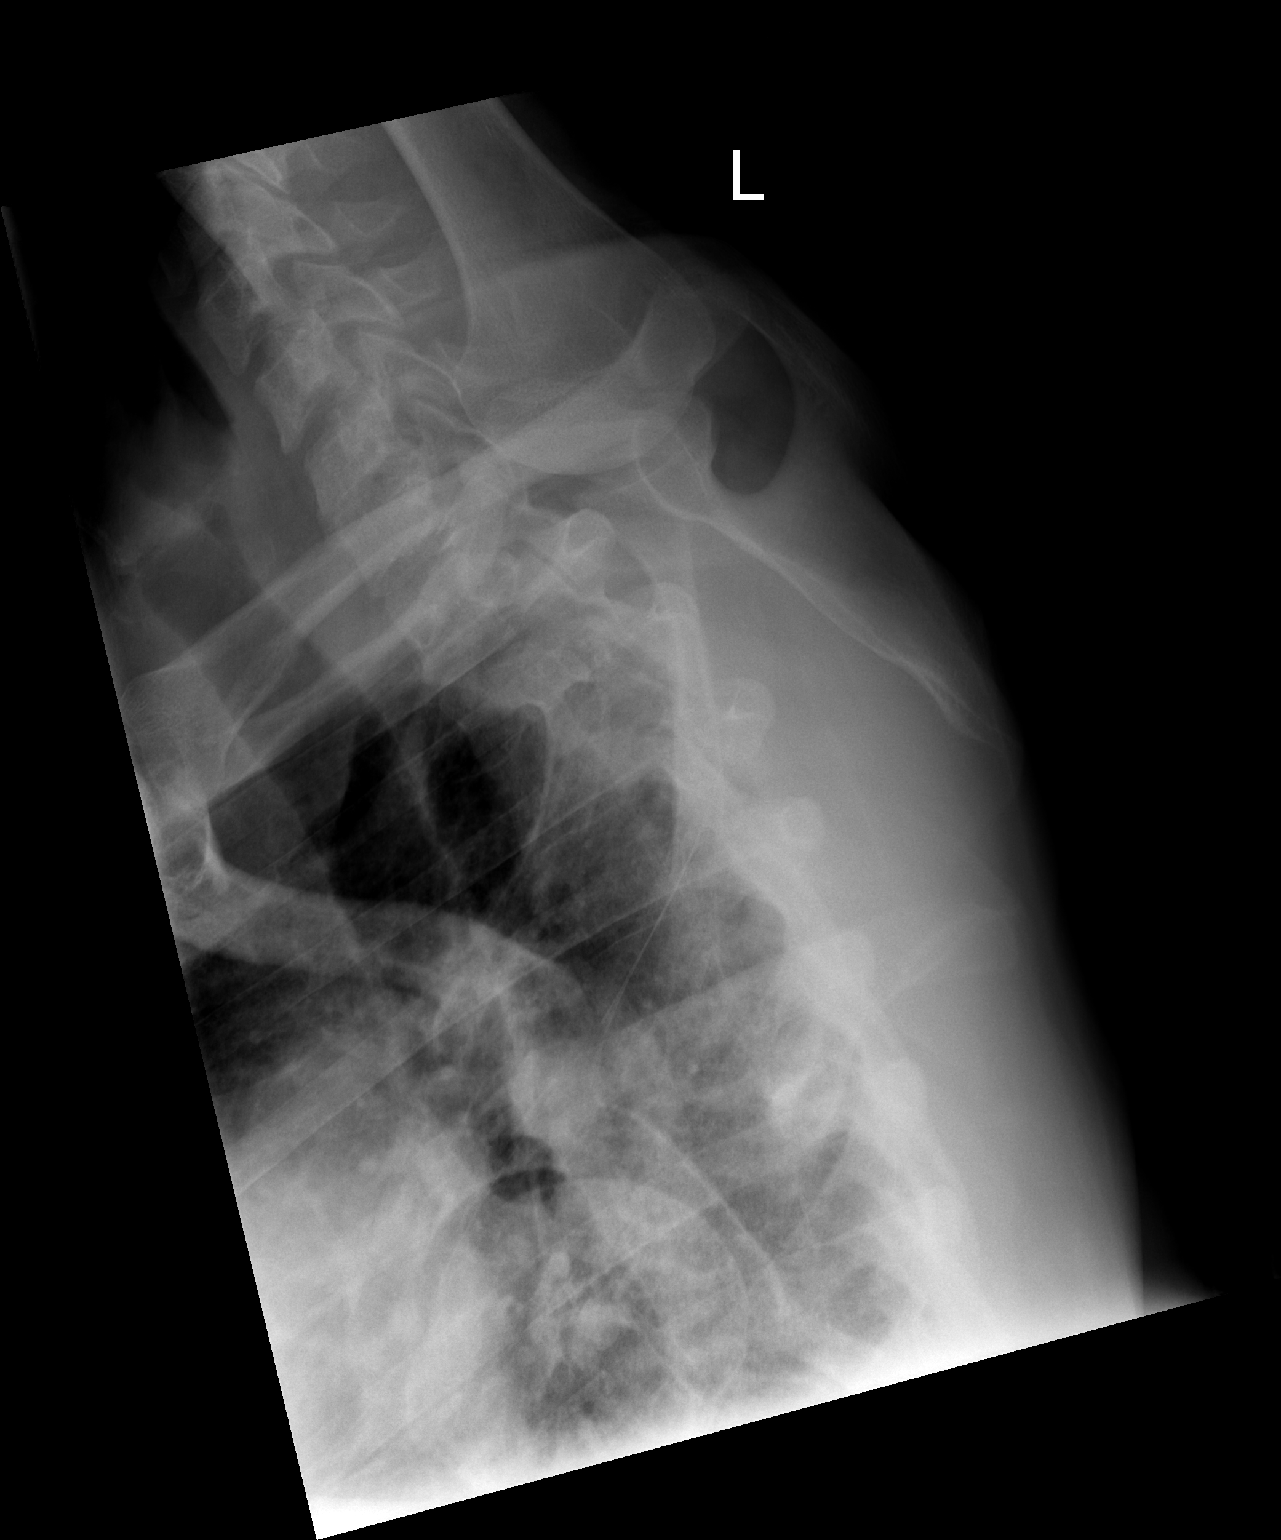

[3 of 3 positions shown; findings below may reference images not displayed]

FINDINGS: The thoracic vertebrae are in normal alignment.
Intervertebral disc spaces appear normal.  No compression deformity
is seen.  No paravertebral soft tissue swelling is noted.
IMPRESSION: Negative.

## 2015-08-01 ENCOUNTER — Emergency Department (HOSPITAL_COMMUNITY)
Admission: EM | Admit: 2015-08-01 | Discharge: 2015-08-02 | Disposition: A | Discharge status: Home / Self Care | Payer: Self-pay | Attending: Emergency Medicine | Admitting: Emergency Medicine

## 2015-08-01 ENCOUNTER — Encounter (HOSPITAL_COMMUNITY): Payer: Self-pay

## 2015-08-01 DIAGNOSIS — F17210 Nicotine dependence, cigarettes, uncomplicated: Secondary | ICD-10-CM | POA: Insufficient documentation

## 2015-08-01 DIAGNOSIS — Y999 Unspecified external cause status: Secondary | ICD-10-CM | POA: Insufficient documentation

## 2015-08-01 DIAGNOSIS — S46819A Strain of other muscles, fascia and tendons at shoulder and upper arm level, unspecified arm, initial encounter: Secondary | ICD-10-CM

## 2015-08-01 DIAGNOSIS — Y92410 Unspecified street and highway as the place of occurrence of the external cause: Secondary | ICD-10-CM | POA: Insufficient documentation

## 2015-08-01 DIAGNOSIS — S46899A Other injury of other muscles, fascia and tendons at shoulder and upper arm level, unspecified arm, initial encounter: Secondary | ICD-10-CM | POA: Insufficient documentation

## 2015-08-01 DIAGNOSIS — Y9389 Activity, other specified: Secondary | ICD-10-CM | POA: Insufficient documentation
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
## 2015-08-02 MED ORDER — IBUPROFEN 600 MG PO TABS: 600.0000 mg | ORAL_TABLET | Freq: Four times a day (QID) | ORAL | Status: AC | PRN

## 2015-08-02 MED ORDER — CARISOPRODOL 350 MG PO TABS
350.0000 mg | ORAL_TABLET | Freq: Three times a day (TID) | ORAL | Status: AC
Start: 2015-08-02 — End: ?

## 2015-08-02 MED ORDER — TRAMADOL HCL 50 MG PO TABS: 50.0000 mg | ORAL_TABLET | Freq: Four times a day (QID) | ORAL | Status: AC | PRN

## 2015-08-02 MED ORDER — DIAZEPAM 5 MG PO TABS
10.0000 mg | ORAL_TABLET | Freq: Once | ORAL | Status: AC
  Administered 2015-08-02: 10 mg via ORAL
  Filled 2015-08-02: qty 2

## 2015-08-02 MED ORDER — ONDANSETRON HCL 4 MG PO TABS
4.0000 mg | ORAL_TABLET | Freq: Once | ORAL | Status: AC
  Administered 2015-08-02: 4 mg via ORAL
  Filled 2015-08-02: qty 1

## 2015-08-02 MED ORDER — TRAMADOL HCL 50 MG PO TABS
100.0000 mg | ORAL_TABLET | Freq: Once | ORAL | Status: AC
  Administered 2015-08-02: 100 mg via ORAL
  Filled 2015-08-02: qty 2

## 2015-08-02 MED ORDER — KETOROLAC TROMETHAMINE 10 MG PO TABS
10.0000 mg | ORAL_TABLET | Freq: Once | ORAL | Status: AC
  Administered 2015-08-02: 10 mg via ORAL
  Filled 2015-08-02: qty 1
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
## 2017-05-12 ENCOUNTER — Encounter: Attending: Internal Medicine | Primary: Internal Medicine

## 2017-05-22 ENCOUNTER — Ambulatory Visit: Admit: 2017-05-22 | Payer: PRIVATE HEALTH INSURANCE | Attending: Internal Medicine | Primary: Internal Medicine

## 2017-05-22 ENCOUNTER — Encounter: Attending: Internal Medicine | Primary: Internal Medicine

## 2017-05-22 DIAGNOSIS — F320 Major depressive disorder, single episode, mild: Secondary | ICD-10-CM

## 2017-05-22 MED ORDER — TRAZODONE 100 MG TAB
100 mg | ORAL_TABLET | Freq: Every evening | ORAL | 3 refills | Status: DC
Start: 2017-05-22 — End: 2017-09-19

## 2017-05-22 MED ORDER — OMEPRAZOLE 40 MG CAP, DELAYED RELEASE
40 mg | ORAL_CAPSULE | Freq: Every day | ORAL | 3 refills | Status: DC
Start: 2017-05-22 — End: 2017-09-19

## 2017-05-22 MED ORDER — LORAZEPAM 1 MG TAB
1 mg | ORAL_TABLET | Freq: Every evening | ORAL | 0 refills | Status: DC | PRN
Start: 2017-05-22 — End: 2017-06-22
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
## 2017-05-22 NOTE — Progress Notes (Signed)
Chief Complaint   Patient presents with   ??? Establish Care   ??? Anxiety     was tried on wellbutrin, klonoipin and zoloft   ??? Sleep Problem     not sleeping very much due to mind not stopping   ??? Referral Request     GI- for GERD       1. Have you been to the ER, urgent care clinic since your last visit?  Hospitalized since your last visit?No    2. Have you seen or consulted any other health care providers outside of the [facility] since your last visit?  Include any pap smears or colon screening. No

## 2017-05-23 LAB — METABOLIC PANEL, COMPREHENSIVE
A-G Ratio: 1.8 (ref 1.2–2.2)
ALT (SGPT): 17 IU/L (ref 0–44)
AST (SGOT): 38 IU/L (ref 0–40)
Albumin: 4.4 g/dL (ref 3.5–5.5)
Alk. phosphatase: 71 IU/L (ref 39–117)
BUN/Creatinine ratio: 11 (ref 9–20)
BUN: 8 mg/dL (ref 6–24)
Bilirubin, total: 0.2 mg/dL (ref 0.0–1.2)
CO2: 24 mmol/L (ref 20–29)
Calcium: 9.1 mg/dL (ref 8.7–10.2)
Chloride: 105 mmol/L (ref 96–106)
Creatinine: 0.75 mg/dL — ABNORMAL LOW (ref 0.76–1.27)
GFR est AA: 128 mL/min/{1.73_m2} (ref >59)
GFR est non-AA: 111 mL/min/{1.73_m2} (ref >59)
GLOBULIN, TOTAL: 2.4 g/dL (ref 1.5–4.5)
Glucose: 79 mg/dL (ref 65–99)
Potassium: 4.5 mmol/L (ref 3.5–5.2)
Protein, total: 6.8 g/dL (ref 6.0–8.5)
Sodium: 142 mmol/L (ref 134–144)

## 2017-05-23 LAB — CBC WITH AUTOMATED DIFF
ABS. BASOPHILS: 0 10*3/uL (ref 0.0–0.2)
ABS. EOSINOPHILS: 0.1 10*3/uL (ref 0.0–0.4)
ABS. IMM. GRANS.: 0 10*3/uL (ref 0.0–0.1)
ABS. MONOCYTES: 0.5 10*3/uL (ref 0.1–0.9)
ABS. NEUTROPHILS: 6.1 10*3/uL (ref 1.4–7.0)
Abs Lymphocytes: 2.3 10*3/uL (ref 0.7–3.1)
BASOPHILS: 0 %
EOSINOPHILS: 2 %
HCT: 47.3 % (ref 37.5–51.0)
HGB: 16 g/dL (ref 13.0–17.7)
IMMATURE GRANULOCYTES: 0 %
Lymphocytes: 25 %
MCH: 30.6 pg (ref 26.6–33.0)
MCHC: 33.8 g/dL (ref 31.5–35.7)
MCV: 90 fL (ref 79–97)
MONOCYTES: 6 %
NEUTROPHILS: 67 %
PLATELET: 241 10*3/uL (ref 150–379)
RBC: 5.23 x10E6/uL (ref 4.14–5.80)
RDW: 13.6 % (ref 12.3–15.4)
WBC: 9.2 10*3/uL (ref 3.4–10.8)

## 2017-05-23 LAB — LIPID PANEL
Cholesterol, total: 204 mg/dL — ABNORMAL HIGH (ref 100–199)
HDL Cholesterol: 38 mg/dL — ABNORMAL LOW (ref >39)
LDL, calculated: 122 mg/dL — ABNORMAL HIGH (ref 0–99)
Triglyceride: 219 mg/dL — ABNORMAL HIGH (ref 0–149)
VLDL, calculated: 44 mg/dL — ABNORMAL HIGH (ref 5–40)

## 2017-05-23 LAB — TSH 3RD GENERATION: TSH: 1.39 u[IU]/mL (ref 0.450–4.500)

## 2017-06-22 ENCOUNTER — Encounter

## 2017-06-22 MED ORDER — LORAZEPAM 1 MG TAB
1 mg | ORAL_TABLET | ORAL | 0 refills | Status: DC
Start: 2017-06-22 — End: 2017-06-26

## 2017-06-26 ENCOUNTER — Ambulatory Visit: Admit: 2017-06-26 | Payer: PRIVATE HEALTH INSURANCE | Attending: Internal Medicine | Primary: Internal Medicine

## 2017-06-26 ENCOUNTER — Ambulatory Visit: Attending: Internal Medicine | Primary: Internal Medicine

## 2017-06-26 DIAGNOSIS — F320 Major depressive disorder, single episode, mild: Secondary | ICD-10-CM

## 2017-06-26 MED ORDER — BUSPIRONE 7.5 MG TAB
7.5 mg | ORAL_TABLET | Freq: Two times a day (BID) | ORAL | 3 refills | Status: AC
Start: 2017-06-26 — End: ?

## 2017-06-26 MED ORDER — LORAZEPAM 1 MG TAB
1 mg | ORAL_TABLET | Freq: Every evening | ORAL | 0 refills | Status: AC | PRN
Start: 2017-06-26 — End: ?

## 2017-06-26 NOTE — Progress Notes (Signed)
Mike Brown is a 47 y.o.  male and presents with     Chief Complaint   Patient presents with   ??? Anxiety     1 month f/u   ??? Depression   ??? Insomnia   ??? Erectile Dysfunction     Pt repors that he is sleeping well after he started taking trazadone.  Howver he still has lot of anxiety duriing the day.  H thinks it is his stressful job.  Ativan seems to be helping , however he realizes that he should not be taking regularly  He also has ED and loss of libido . He says he had vaectomy at age 47 and wonders if that is causing ED and that in turn could be causing him to feel fatigued and depressed        Past Medical History:   Diagnosis Date   ??? Anxiety    ??? GERD (gastroesophageal reflux disease)      Past Surgical History:   Procedure Laterality Date   ??? HX VASECTOMY       Current Outpatient Medications   Medication Sig   ??? busPIRone (BUSPAR) 7.5 mg tablet Take 1 Tab by mouth two (2) times a day.   ??? LORazepam (ATIVAN) 1 mg tablet Take 1 Tab by mouth nightly as needed for Anxiety. Max Daily Amount: 1 mg.   ??? traZODone (DESYREL) 100 mg tablet Take 1 Tab by mouth nightly.   ??? omeprazole (PRILOSEC) 40 mg capsule Take 1 Cap by mouth daily.     No current facility-administered medications for this visit.      Health Maintenance   Topic Date Due   ??? Pneumococcal 0-64 years (1 of 1 - PPSV23) 09/20/1977   ??? DTaP/Tdap/Td series (1 - Tdap) 09/20/1992   ??? Influenza Age 299 to Adult  08/20/2017       There is no immunization history on file for this patient.  No LMP for male patient.        Allergies and Intolerances:   Allergies   Allergen Reactions   ??? Pcn [Penicillins] Hives       Family History:   No family history on file.    Social History:   He  reports that he has been smoking.  He has been smoking about 2.00 packs per day. He has never used smokeless tobacco.  He  reports that he drinks alcohol.            Review of Systems: pos for ED  General: negative for - chills, fatigue, fever, weight change   Psych: positive for - anxiety, depression  ENT: negative for - headaches, hearing change, nasal congestion, oral lesions, sneezing or sore throat  Heme/ Lymph: negative for - bleeding problems, bruising, pallor or swollen lymph nodes  Endo: negative for - hot flashes, polydipsia/polyuria or temperature intolerance  Resp: negative for - cough, shortness of breath or wheezing  CV: negative for - chest pain, edema or palpitations  GI: negative for - abdominal pain, change in bowel habits, constipation, diarrhea or nausea/vomiting  GU: negative for - dysuria, hematuria, incontinence, pelvic pain or vulvar/vaginal symptoms  MSK: negative for - joint pain, joint swelling or muscle pain  Neuro: negative for - confusion, headaches, seizures or weakness  Derm: negative for - dry skin, hair changes, rash or skin lesion changes          Physical:   Vitals:   Vitals:    06/26/17 0842   BP:  111/72   Pulse: (!) 55   Resp: 18   Temp: 98.4 ??F (36.9 ??C)   TempSrc: Oral   SpO2: 98%   Weight: 183 lb 9.6 oz (83.3 kg)   Height: 5\' 9"  (1.753 m)           Exam:   HEENT- atraumatic,normocephalic, awake, oriented, well nourished  Neck - supple,no enlarged lymph nodes, no JVD, no thyromegaly  Chest- CTA, no rhonchi, no crackles  Heart- rrr, no murmurs / gallop/rub  Abdomen- soft,BS+,NT, no hepatosplenomegaly  Ext - no c/c/edema   Neuro- no focal deficits.Power 5/5 all extremities  Skin - warm,dry, no obvious rashes.          Review of Data:   LABS:   Lab Results   Component Value Date/Time    WBC 9.2 05/22/2017 11:59 AM    HGB 16.0 05/22/2017 11:59 AM    HCT 47.3 05/22/2017 11:59 AM    PLATELET 241 05/22/2017 11:59 AM     Lab Results   Component Value Date/Time    Sodium 142 05/22/2017 11:59 AM    Potassium 4.5 05/22/2017 11:59 AM    Chloride 105 05/22/2017 11:59 AM    CO2 24 05/22/2017 11:59 AM    Glucose 79 05/22/2017 11:59 AM    BUN 8 05/22/2017 11:59 AM    Creatinine 0.75 (L) 05/22/2017 11:59 AM     Lab Results    Component Value Date/Time    Cholesterol, total 204 (H) 05/22/2017 11:59 AM    HDL Cholesterol 38 (L) 05/22/2017 11:59 AM    LDL, calculated 122 (H) 05/22/2017 11:59 AM    Triglyceride 219 (H) 05/22/2017 11:59 AM     No results found for: GPT        Impression / Plan:        ICD-10-CM ICD-9-CM    1. Current mild episode of major depressive disorder, unspecified whether recurrent (HCC) F32.0 296.21    2. Anxiety F41.9 300.00 busPIRone (BUSPAR) 7.5 mg tablet      LORazepam (ATIVAN) 1 mg tablet   3. Primary insomnia F51.01 307.42    4. Other male erectile dysfunction N52.8 607.84 TESTOSTERONE, FREE & TOTAL      FSH AND LH      PROLACTIN   5. Loss of libido R68.82 799.81 TESTOSTERONE, FREE & TOTAL      FSH AND LH      PROLACTIN         Explained to patient risk benefits of the medications.Advised patient to stop meds if having any side effects.Pt verbalized understanding of the instructions.    I have discussed the diagnosis with the patient and the intended plan as seen in the above orders.  The patient has received an after-visit summary and questions were answered concerning future plans.  I have discussed medication side effects and warnings with the patient as well. I have reviewed the plan of care with the patient, accepted their input and they are in agreement with the treatment goals.     Reviewed plan of care. Patient has provided input and agrees with goals.    Follow-up and Dispositions    ?? Return in about 3 months (around 09/26/2017).         Jenene Slicker, MD

## 2017-06-26 NOTE — Progress Notes (Signed)
Mike Brown is a 47 y.o.  male and presents with     Chief Complaint   Patient presents with   ??? Anxiety     1 month f/u   ??? Depression   ??? Insomnia   ??? Erectile Dysfunction     Pt repors that he is sleeping well after he started taking trazadone.  Howver he still has lot of anxiety duriing the day.  H thinks it is his stressful job.  Ativan seems to be helping , however he realizes that he should not be taking regularly  He also has ED and loss of libido . He says he had vaectomy at age 2 and wonders if that is causing ED and that in turn could be causing him to feel fatigued and depressed        Past Medical History:   Diagnosis Date   ??? Anxiety    ??? GERD (gastroesophageal reflux disease)      Past Surgical History:   Procedure Laterality Date   ??? HX VASECTOMY       Current Outpatient Medications   Medication Sig   ??? busPIRone (BUSPAR) 7.5 mg tablet Take 1 Tab by mouth two (2) times a day.   ??? LORazepam (ATIVAN) 1 mg tablet Take 1 Tab by mouth nightly as needed for Anxiety. Max Daily Amount: 1 mg.   ??? traZODone (DESYREL) 100 mg tablet Take 1 Tab by mouth nightly.   ??? omeprazole (PRILOSEC) 40 mg capsule Take 1 Cap by mouth daily.     No current facility-administered medications for this visit.      Health Maintenance   Topic Date Due   ??? Pneumococcal 0-64 years (1 of 1 - PPSV23) 09/20/1977   ??? DTaP/Tdap/Td series (1 - Tdap) 09/20/1992   ??? Influenza Age 49 to Adult  08/20/2017       There is no immunization history on file for this patient.  No LMP for male patient.        Allergies and Intolerances:   Allergies   Allergen Reactions   ??? Pcn [Penicillins] Hives       Family History:   No family history on file.    Social History:   He  reports that he has been smoking.  He has been smoking about 2.00 packs per day. He has never used smokeless tobacco.  He  reports that he drinks alcohol.            Review of Systems: pos for ED  General: negative for - chills, fatigue, fever, weight change  Psych: positive for -  anxiety, depression  ENT: negative for - headaches, hearing change, nasal congestion, oral lesions, sneezing or sore throat  Heme/ Lymph: negative for - bleeding problems, bruising, pallor or swollen lymph nodes  Endo: negative for - hot flashes, polydipsia/polyuria or temperature intolerance  Resp: negative for - cough, shortness of breath or wheezing  CV: negative for - chest pain, edema or palpitations  GI: negative for - abdominal pain, change in bowel habits, constipation, diarrhea or nausea/vomiting  GU: negative for - dysuria, hematuria, incontinence, pelvic pain or vulvar/vaginal symptoms  MSK: negative for - joint pain, joint swelling or muscle pain  Neuro: negative for - confusion, headaches, seizures or weakness  Derm: negative for - dry skin, hair changes, rash or skin lesion changes          Physical:   Vitals:   Vitals:    06/26/17 0842   BP:  111/72   Pulse: (!) 55   Resp: 18   Temp: 98.4 ??F (36.9 ??C)   TempSrc: Oral   SpO2: 98%   Weight: 183 lb 9.6 oz (83.3 kg)   Height: 5\' 9"  (1.753 m)           Exam:   HEENT- atraumatic,normocephalic, awake, oriented, well nourished  Neck - supple,no enlarged lymph nodes, no JVD, no thyromegaly  Chest- CTA, no rhonchi, no crackles  Heart- rrr, no murmurs / gallop/rub  Abdomen- soft,BS+,NT, no hepatosplenomegaly  Ext - no c/c/edema   Neuro- no focal deficits.Power 5/5 all extremities  Skin - warm,dry, no obvious rashes.          Review of Data:   LABS:   Lab Results   Component Value Date/Time    WBC 9.2 05/22/2017 11:59 AM    HGB 16.0 05/22/2017 11:59 AM    HCT 47.3 05/22/2017 11:59 AM    PLATELET 241 05/22/2017 11:59 AM     Lab Results   Component Value Date/Time    Sodium 142 05/22/2017 11:59 AM    Potassium 4.5 05/22/2017 11:59 AM    Chloride 105 05/22/2017 11:59 AM    CO2 24 05/22/2017 11:59 AM    Glucose 79 05/22/2017 11:59 AM    BUN 8 05/22/2017 11:59 AM    Creatinine 0.75 (L) 05/22/2017 11:59 AM     Lab Results   Component Value Date/Time    Cholesterol,  total 204 (H) 05/22/2017 11:59 AM    HDL Cholesterol 38 (L) 05/22/2017 11:59 AM    LDL, calculated 122 (H) 05/22/2017 11:59 AM    Triglyceride 219 (H) 05/22/2017 11:59 AM     No results found for: GPT        Impression / Plan:        ICD-10-CM ICD-9-CM    1. Current mild episode of major depressive disorder, unspecified whether recurrent (HCC) F32.0 296.21    2. Anxiety F41.9 300.00 busPIRone (BUSPAR) 7.5 mg tablet      LORazepam (ATIVAN) 1 mg tablet   3. Primary insomnia F51.01 307.42    4. Other male erectile dysfunction N52.8 607.84 TESTOSTERONE, FREE & TOTAL      FSH AND LH      PROLACTIN   5. Loss of libido R68.82 799.81 TESTOSTERONE, FREE & TOTAL      FSH AND LH      PROLACTIN         Explained to patient risk benefits of the medications.Advised patient to stop meds if having any side effects.Pt verbalized understanding of the instructions.    I have discussed the diagnosis with the patient and the intended plan as seen in the above orders.  The patient has received an after-visit summary and questions were answered concerning future plans.  I have discussed medication side effects and warnings with the patient as well. I have reviewed the plan of care with the patient, accepted their input and they are in agreement with the treatment goals.     Reviewed plan of care. Patient has provided input and agrees with goals.    Follow-up and Dispositions    ?? Return in about 3 months (around 09/26/2017).         Jenene SlickerPravin M Doria Fern, MD

## 2017-06-28 LAB — TESTOSTERONE, FREE & TOTAL
FREE TESTOSTERONE,DIRECT, 144981: 8.5 pg/mL (ref 6.8–21.5)
Free testosterone (Direct): 8.5 pg/mL (ref 6.8–21.5)
Testosterone: 399 ng/dL (ref 264–916)
Testosterone: 399 ng/dL (ref 264–916)

## 2017-06-28 LAB — PROLACTIN
Prolactin: 5.6 ng/mL (ref 4.0–15.2)
Prolactin: 5.6 ng/mL (ref 4.0–15.2)

## 2017-06-28 LAB — FSH AND LH
FSH, Serum: 10.5 m[IU]/mL (ref 1.5–12.4)
FSH: 10.5 m[IU]/mL (ref 1.5–12.4)
Luteinizing Hormone: 7.1 m[IU]/mL (ref 1.7–8.6)
Luteinizing hormone: 7.1 m[IU]/mL (ref 1.7–8.6)

## 2017-07-21 ENCOUNTER — Encounter

## 2017-07-21 NOTE — Telephone Encounter (Signed)
I already referred pt to Psych the first visit, Pt needs to get his meds refilled by Psych.

## 2017-07-24 NOTE — Telephone Encounter (Signed)
I cannot refill lorazepm, needs to see Psych

## 2017-08-19 ENCOUNTER — Ambulatory Visit: Admit: 2017-08-19 | Payer: PRIVATE HEALTH INSURANCE | Attending: Family | Primary: Internal Medicine

## 2017-08-19 DIAGNOSIS — K921 Melena: Secondary | ICD-10-CM

## 2017-08-19 MED ORDER — FAMOTIDINE 20 MG TAB
20 mg | ORAL_TABLET | Freq: Every evening | ORAL | 1 refills | Status: DC
Start: 2017-08-19 — End: 2017-09-21

## 2017-08-19 NOTE — Progress Notes (Signed)
Please call, CBC is normal.  Have him complete his stool kit and follow-up with GI.

## 2017-08-19 NOTE — Progress Notes (Signed)
Please call and have him follow-up with gastroenterology as his stool test was positive for blood.

## 2017-08-19 NOTE — Progress Notes (Signed)
1st Attempt- No answer/Voice Mail not set-up/ lab letter and GI referral sent. End of encounter.

## 2017-08-19 NOTE — Patient Instructions (Signed)
Body Mass Index: Care Instructions  Your Care Instructions    Body mass index (BMI) can help you see if your weight is raising your risk for health problems. It uses a formula to compare how much you weigh with how tall you are.  ?? A BMI lower than 18.5 is considered underweight.  ?? A BMI between 18.5 and 24.9 is considered healthy.  ?? A BMI between 25 and 29.9 is considered overweight. A BMI of 30 or higher is considered obese.  If your BMI is in the normal range, it means that you have a lower risk for weight-related health problems. If your BMI is in the overweight or obese range, you may be at increased risk for weight-related health problems, such as high blood pressure, heart disease, stroke, arthritis or joint pain, and diabetes. If your BMI is in the underweight range, you may be at increased risk for health problems such as fatigue, lower protection (immunity) against illness, muscle loss, bone loss, hair loss, and hormone problems.  BMI is just one measure of your risk for weight-related health problems. You may be at higher risk for health problems if you are not active, you eat an unhealthy diet, or you drink too much alcohol or use tobacco products.  Follow-up care is a key part of your treatment and safety. Be sure to make and go to all appointments, and call your doctor if you are having problems. It's also a good idea to know your test results and keep a list of the medicines you take.  How can you care for yourself at home?  ?? Practice healthy eating habits. This includes eating plenty of fruits, vegetables, whole grains, lean protein, and low-fat dairy.  ?? If your doctor recommends it, get more exercise. Walking is a good choice. Bit by bit, increase the amount you walk every day. Try for at least 30 minutes on most days of the week.  ?? Do not smoke. Smoking can increase your risk for health problems. If you need help quitting, talk to your doctor about stop-smoking programs and  medicines. These can increase your chances of quitting for good.  ?? Limit alcohol to 2 drinks a day for men and 1 drink a day for women. Too much alcohol can cause health problems.  If you have a BMI higher than 25  ?? Your doctor may do other tests to check your risk for weight-related health problems. This may include measuring the distance around your waist. A waist measurement of more than 40 inches in men or 35 inches in women can increase the risk of weight-related health problems.  ?? Talk with your doctor about steps you can take to stay healthy or improve your health. You may need to make lifestyle changes to lose weight and stay healthy, such as changing your diet and getting regular exercise.  If you have a BMI lower than 18.5  ?? Your doctor may do other tests to check your risk for health problems.  ?? Talk with your doctor about steps you can take to stay healthy or improve your health. You may need to make lifestyle changes to gain or maintain weight and stay healthy, such as getting more healthy foods in your diet and doing exercises to build muscle.  Where can you learn more?  Go to http://www.healthwise.net/[employer].  Enter S176 in the search box to learn more about "Body Mass Index: Care Instructions."  Current as of: November 02, 2014  Content Version: 11.4  ??   2006-2017 [employer]. Care instructions adapted under license by [employer] (which disclaims liability or warranty for this information). If you have questions about a medical condition or this instruction, always ask your healthcare professional. [employer] disclaims any warranty or liability for your use of this information.

## 2017-08-19 NOTE — Progress Notes (Signed)
Attempted to contact patient and his voice mail box has not been set up yet

## 2017-08-19 NOTE — Progress Notes (Signed)
Chief Complaint   Patient presents with   ??? Other     patient is passing bright red blood in stool does not feel like he has a hemroid, off and on for the last couple of years with complaints of stomach discomfort, with nausea.     1. Have you been to an emergency room, urgent clinic, or hospitalized since your last visit?  no  If yes, where when, and reason for visit?     2. Have seen or consulted any other health care provider since your last visit? NO  Please include any pap smears or colon screening in this section  If yes, where when, and reason for visit?      3. Have you had any blood work or xray, including a mammogram since your last visit NO  If yes, where when, and reason for visit?    4. Are there any changes in medications including immunizations since your last visit? NO  If yes, where when, and reason for visit?    5. Would you like to speak with a health care team member about safety in your home? NO    6. Do you have an Advanced Directive/ Living Will in place? NO  If yes, do we have a copy on file NO  If no, would you like information NO

## 2017-08-19 NOTE — Progress Notes (Signed)
[facility]   [address], [address]  [place], [place] [address]  P: [phone]  F: [phone]      Chief Complaint   Patient presents with   ??? Other     patient is passing bright red blood in stool does not feel like he has a hemroid, off and on for the last couple of years with complaints of stomach discomfort, with nausea.       SUBJECTIVE   Mike Brown is a 47 y.o. male who presents to clinic for Other (patient is passing bright red blood in stool does not feel like he has a hemroid, off and on for the last couple of years with complaints of stomach discomfort, with nausea.).  HPI:    Mike Brown is a 47 year old male previously seen by Dr. Danella Penton in our office here for a history of depression, anxiety, insomnia, tobacco and alcohol use, ED, and GERD.  Today he presents for about 1 month of episodic stools with bright red blood with epigastric discomfort.  He states he is currently taking omeprazole 40 mg in the morning, but is still having quite a bit of stomach discomfort.  He states he had his girlfriend check his rectum for hemorrhoids, and she did not find any.  He notes if he eats the pain is improved after about 1 hour.  He does note that he smokes 2 packs a day, drinks 3 beers daily, drinks 3-4 Dr. Alcus Dad daily, and has one red bull in the morning.  He notes that does have issues with chronic constipation and straining, but generally has a stool at least every 1 to 2 days.    There are no active problems to display for this patient.    Past Medical History:   Diagnosis Date   ??? Anxiety    ??? GERD (gastroesophageal reflux disease)      Past Surgical History:   Procedure Laterality Date   ??? HX VASECTOMY       Social History     Socioeconomic History   ??? Marital status: MARRIED     Spouse name: Not on file   ??? Number of children: Not on file   ??? Years of education: Not on file   ??? Highest education level: Not on file   Occupational History   ??? Not on file   Social Needs    ??? Financial resource strain: Not on file   ??? Food insecurity:     Worry: Not on file     Inability: Not on file   ??? Transportation needs:     Medical: Not on file     Non-medical: Not on file   Tobacco Use   ??? Smoking status: Current Every Day Smoker     Packs/day: 2.00   ??? Smokeless tobacco: Never Used   ??? Tobacco comment: tried chantix   Substance and Sexual Activity   ??? Alcohol use: Yes     Comment: 2-3 beers with dinner, not every night but most nights   ??? Drug use: Never   ??? Sexual activity: Not on file   Lifestyle   ??? Physical activity:     Days per week: Not on file     Minutes per session: Not on file   ??? Stress: Not on file   Relationships   ??? Social connections:     Talks on phone: Not on file     Gets together: Not on file  Attends religious service: Not on file     Active member of club or organization: Not on file     Attends meetings of clubs or organizations: Not on file     Relationship status: Not on file   ??? Intimate partner violence:     Fear of current or ex partner: Not on file     Emotionally abused: Not on file     Physically abused: Not on file     Forced sexual activity: Not on file   Other Topics Concern   ??? Not on file   Social History Narrative   ??? Not on file     History reviewed. No pertinent family history.  Allergies   Allergen Reactions   ??? Pcn [Penicillins] Hives       Current Outpatient Medications   Medication Sig Dispense Refill   ??? famotidine (PEPCID) 20 mg tablet Take 1 Tab by mouth nightly. 90 Tab 1   ??? busPIRone (BUSPAR) 7.5 mg tablet Take 1 Tab by mouth two (2) times a day. 60 Tab 3   ??? LORazepam (ATIVAN) 1 mg tablet Take 1 Tab by mouth nightly as needed for Anxiety. Max Daily Amount: 1 mg. 20 Tab 0   ??? traZODone (DESYREL) 100 mg tablet Take 1 Tab by mouth nightly. 30 Tab 3   ??? omeprazole (PRILOSEC) 40 mg capsule Take 1 Cap by mouth daily. 30 Cap 3           The medications were reviewed and updated in the medical record.   The past medical history, past surgical history, and family history were reviewed and updated in the medical record.    REVIEW OF SYSTEMS   Review of Systems   Constitutional: Negative for fever and malaise/fatigue.   HENT: Negative for congestion.    Eyes: Negative for blurred vision and pain.   Respiratory: Negative for cough and shortness of breath.    Cardiovascular: Negative for chest pain and palpitations.   Gastrointestinal: Positive for blood in stool, constipation and heartburn. Negative for abdominal pain, diarrhea, melena, nausea and vomiting.   Genitourinary: Negative for frequency and urgency.   Musculoskeletal: Negative for joint pain and myalgias.   Neurological: Negative for dizziness, tingling, sensory change, weakness and headaches.   Psychiatric/Behavioral: Negative for depression, memory loss and substance abuse.         PHYSICAL EXAM     Visit Vitals  BP 111/82 (BP 1 Location: Left arm, BP Patient Position: Sitting)   Pulse 71   Temp 98.1 ??F (36.7 ??C)   Resp 16   Ht 5\' 9"  (1.753 m)   Wt 190 lb 9.6 oz (86.5 kg)   SpO2 96%   BMI 28.15 kg/m??       Physical Exam   Constitutional: He is oriented to person, place, and time and well-developed, well-nourished, and in no distress. He appears unhealthy.   Smells of cigarettes, BMI 28   HENT:   Head: Normocephalic and atraumatic.   Right Ear: External ear normal.   Left Ear: External ear normal.   Cardiovascular: Normal rate, regular rhythm and normal heart sounds.   Pulmonary/Chest: Effort normal and breath sounds normal.   Abdominal: Soft. Normal appearance. There is no tenderness.   Declines rectal exam today   Musculoskeletal: Normal range of motion. He exhibits no edema.   Neurological: He is alert and oriented to person, place, and time. Gait normal.   Skin: Skin is warm and dry.   Multiple  tattoos   Psychiatric: Affect and judgment normal.   Nursing note and vitals reviewed.    ASSESSMENT/ PLAN   Diagnoses and all orders for this visit:     1. Bloody stools  -     CBC W/O DIFF  -     REFERRAL TO GASTROENTEROLOGY  -     OCCULT BLOOD IMMUNOASSAY,DIAGNOSTIC   -Discussed possibly internal hemorrhoid with GERD/ ?gastric ulcer, but the patient needs further eval with GI.      2. Constipation, unspecified constipation type        -Encouraged hydration with water and to use OTC stool softener like Colace to prevent straining.    3. Chronic GERD  -     REFERRAL TO GASTROENTEROLOGY  -     OCCULT BLOOD IMMUNOASSAY,DIAGNOSTIC  -Continue omeprazole 40 mg tablet daily  -   Start  famotidine (PEPCID) 20 mg tablet; Take 1 Tab by mouth nightly.    4. Tobacco abuse       -The patient was counseled on the dangers of tobacco use, and was advised to quit.  Reviewed strategies to maximize success, including removing cigarettes and smoking materials from environment, stress management, substitution of other forms of reinforcement, support of family/friends and written materials.    5. Alcohol use     -Counseled on 2 drinks equivalents or less daily      Follow-up and Dispositions    ?? Return if symptoms worsen or fail to improve.         Disclaimer:  Advised patient to call back or return to office if symptoms worsen/change/persist.  Discussed expected course/resolution/complications of diagnosis in detail with patient.  ??  Medication risks/benefits/alternatives discussed with patient.  Patient was given an after visit summary which includes diagnoses, current medications, & vitals.   ??  Discussed patient instructions and advised to read to all patient instructions regarding care.   ??  Patient expressed understanding with the diagnosis and plan.   This note will not be viewable in MyChart.        Arbutus PedJoseph F Laquita Harlan, NP  08/19/2017        (This document has been electronically signed)  Discussed the patient's BMI with him.  The BMI follow up plan is as follows:     dietary management education, guidance, and counseling  encourage exercise  monitor weight  prescribed dietary intake     An After Visit Summary was printed and given to the patient.

## 2017-08-19 NOTE — Progress Notes (Signed)
 Chief Complaint   Patient presents with   . Other     patient is passing bright red blood in stool does not feel like he has a hemroid, off and on for the last couple of years with complaints of stomach discomfort, with nausea.     1. Have you been to an emergency room, urgent clinic, or hospitalized since your last visit?  no  If yes, where when, and reason for visit?     2. Have seen or consulted any other health care provider since your last visit? NO  Please include any pap smears or colon screening in this section  If yes, where when, and reason for visit?      3. Have you had any blood work or xray, including a mammogram since your last visit NO  If yes, where when, and reason for visit?    4. Are there any changes in medications including immunizations since your last visit? NO  If yes, where when, and reason for visit?    5. Would you like to speak with a health care team member about safety in your home? NO    6. Do you have an Advanced Directive/ Living Will in place? NO  If yes, do we have a copy on file NO  If no, would you like information NO

## 2017-08-19 NOTE — Progress Notes (Signed)
[facility]   [address], [address]  [place], [place] [address]  P: [phone]  F: [phone]      Chief Complaint   Patient presents with   ??? Other     patient is passing bright red blood in stool does not feel like he has a hemroid, off and on for the last couple of years with complaints of stomach discomfort, with nausea.       SUBJECTIVE   Mike Brown is a 47 y.o. male who presents to clinic for Other (patient is passing bright red blood in stool does not feel like he has a hemroid, off and on for the last couple of years with complaints of stomach discomfort, with nausea.).  HPI:    Kvion is a 47 year old male previously seen by Dr. Danella Penton in our office here for a history of depression, anxiety, insomnia, tobacco and alcohol use, ED, and GERD.  Today he presents for about 1 month of episodic stools with bright red blood with epigastric discomfort.  He states he is currently taking omeprazole 40 mg in the morning, but is still having quite a bit of stomach discomfort.  He states he had his girlfriend check his rectum for hemorrhoids, and she did not find any.  He notes if he eats the pain is improved after about 1 hour.  He does note that he smokes 2 packs a day, drinks 3 beers daily, drinks 3-4 Dr. Alcus Dad daily, and has one red bull in the morning.  He notes that does have issues with chronic constipation and straining, but generally has a stool at least every 1 to 2 days.    There are no active problems to display for this patient.    Past Medical History:   Diagnosis Date   ??? Anxiety    ??? GERD (gastroesophageal reflux disease)      Past Surgical History:   Procedure Laterality Date   ??? HX VASECTOMY       Social History     Socioeconomic History   ??? Marital status: MARRIED     Spouse name: Not on file   ??? Number of children: Not on file   ??? Years of education: Not on file   ??? Highest education level: Not on file   Occupational History   ??? Not on file   Social Needs   ??? Financial resource  strain: Not on file   ??? Food insecurity:     Worry: Not on file     Inability: Not on file   ??? Transportation needs:     Medical: Not on file     Non-medical: Not on file   Tobacco Use   ??? Smoking status: Current Every Day Smoker     Packs/day: 2.00   ??? Smokeless tobacco: Never Used   ??? Tobacco comment: tried chantix   Substance and Sexual Activity   ??? Alcohol use: Yes     Comment: 2-3 beers with dinner, not every night but most nights   ??? Drug use: Never   ??? Sexual activity: Not on file   Lifestyle   ??? Physical activity:     Days per week: Not on file     Minutes per session: Not on file   ??? Stress: Not on file   Relationships   ??? Social connections:     Talks on phone: Not on file     Gets together: Not on file  Attends religious service: Not on file     Active member of club or organization: Not on file     Attends meetings of clubs or organizations: Not on file     Relationship status: Not on file   ??? Intimate partner violence:     Fear of current or ex partner: Not on file     Emotionally abused: Not on file     Physically abused: Not on file     Forced sexual activity: Not on file   Other Topics Concern   ??? Not on file   Social History Narrative   ??? Not on file     History reviewed. No pertinent family history.  Allergies   Allergen Reactions   ??? Pcn [Penicillins] Hives       Current Outpatient Medications   Medication Sig Dispense Refill   ??? famotidine (PEPCID) 20 mg tablet Take 1 Tab by mouth nightly. 90 Tab 1   ??? busPIRone (BUSPAR) 7.5 mg tablet Take 1 Tab by mouth two (2) times a day. 60 Tab 3   ??? LORazepam (ATIVAN) 1 mg tablet Take 1 Tab by mouth nightly as needed for Anxiety. Max Daily Amount: 1 mg. 20 Tab 0   ??? traZODone (DESYREL) 100 mg tablet Take 1 Tab by mouth nightly. 30 Tab 3   ??? omeprazole (PRILOSEC) 40 mg capsule Take 1 Cap by mouth daily. 30 Cap 3           The medications were reviewed and updated in the medical record.  The past medical history, past surgical history, and family history  were reviewed and updated in the medical record.    REVIEW OF SYSTEMS   Review of Systems   Constitutional: Negative for fever and malaise/fatigue.   HENT: Negative for congestion.    Eyes: Negative for blurred vision and pain.   Respiratory: Negative for cough and shortness of breath.    Cardiovascular: Negative for chest pain and palpitations.   Gastrointestinal: Positive for blood in stool, constipation and heartburn. Negative for abdominal pain, diarrhea, melena, nausea and vomiting.   Genitourinary: Negative for frequency and urgency.   Musculoskeletal: Negative for joint pain and myalgias.   Neurological: Negative for dizziness, tingling, sensory change, weakness and headaches.   Psychiatric/Behavioral: Negative for depression, memory loss and substance abuse.         PHYSICAL EXAM     Visit Vitals  BP 111/82 (BP 1 Location: Left arm, BP Patient Position: Sitting)   Pulse 71   Temp 98.1 ??F (36.7 ??C)   Resp 16   Ht 5\' 9"  (1.753 m)   Wt 190 lb 9.6 oz (86.5 kg)   SpO2 96%   BMI 28.15 kg/m??       Physical Exam   Constitutional: He is oriented to person, place, and time and well-developed, well-nourished, and in no distress. He appears unhealthy.   Smells of cigarettes, BMI 28   HENT:   Head: Normocephalic and atraumatic.   Right Ear: External ear normal.   Left Ear: External ear normal.   Cardiovascular: Normal rate, regular rhythm and normal heart sounds.   Pulmonary/Chest: Effort normal and breath sounds normal.   Abdominal: Soft. Normal appearance. There is no tenderness.   Declines rectal exam today   Musculoskeletal: Normal range of motion. He exhibits no edema.   Neurological: He is alert and oriented to person, place, and time. Gait normal.   Skin: Skin is warm and dry.   Multiple  tattoos   Psychiatric: Affect and judgment normal.   Nursing note and vitals reviewed.    ASSESSMENT/ PLAN   Diagnoses and all orders for this visit:    1. Bloody stools  -     CBC W/O DIFF  -     REFERRAL TO GASTROENTEROLOGY  -      OCCULT BLOOD IMMUNOASSAY,DIAGNOSTIC   -Discussed possibly internal hemorrhoid with GERD/ ?gastric ulcer, but the patient needs further eval with GI.      2. Constipation, unspecified constipation type        -Encouraged hydration with water and to use OTC stool softener like Colace to prevent straining.    3. Chronic GERD  -     REFERRAL TO GASTROENTEROLOGY  -     OCCULT BLOOD IMMUNOASSAY,DIAGNOSTIC  -Continue omeprazole 40 mg tablet daily  -   Start  famotidine (PEPCID) 20 mg tablet; Take 1 Tab by mouth nightly.    4. Tobacco abuse       -The patient was counseled on the dangers of tobacco use, and was advised to quit.  Reviewed strategies to maximize success, including removing cigarettes and smoking materials from environment, stress management, substitution of other forms of reinforcement, support of family/friends and written materials.    5. Alcohol use     -Counseled on 2 drinks equivalents or less daily      Follow-up and Dispositions    ?? Return if symptoms worsen or fail to improve.         Disclaimer:  Advised patient to call back or return to office if symptoms worsen/change/persist.  Discussed expected course/resolution/complications of diagnosis in detail with patient.  ??  Medication risks/benefits/alternatives discussed with patient.  Patient was given an after visit summary which includes diagnoses, current medications, & vitals.   ??  Discussed patient instructions and advised to read to all patient instructions regarding care.   ??  Patient expressed understanding with the diagnosis and plan.   This note will not be viewable in MyChart.        Arbutus Ped, NP  08/19/2017        (This document has been electronically signed)  Discussed the patient's BMI with him.  The BMI follow up plan is as follows:     dietary management education, guidance, and counseling  encourage exercise  monitor weight  prescribed dietary intake    An After Visit Summary was printed and given to the patient.

## 2017-08-19 NOTE — Progress Notes (Signed)
Attempted to contact patient and his voice mail box has not been set up yet

## 2017-08-19 NOTE — Progress Notes (Signed)
Please call and have him follow-up with gastroenterology as his stool test was positive for blood.

## 2017-08-19 NOTE — Progress Notes (Signed)
1st Attempt- No answer/Voice Mail not set-up/ lab letter and GI referral sent. End of encounter.

## 2017-08-20 ENCOUNTER — Encounter: Attending: Family | Primary: Internal Medicine

## 2017-08-20 LAB — CBC W/O DIFF
HCT: 48 % (ref 37.5–51.0)
HGB: 16.2 g/dL (ref 13.0–17.7)
MCH: 30.7 pg (ref 26.6–33.0)
MCHC: 33.8 g/dL (ref 31.5–35.7)
MCV: 91 fL (ref 79–97)
PLATELET: 227 10*3/uL (ref 150–450)
RBC: 5.27 x10E6/uL (ref 4.14–5.80)
RDW: 13.8 % (ref 12.3–15.4)
WBC: 10.4 10*3/uL (ref 3.4–10.8)

## 2017-08-20 LAB — CBC
Hematocrit: 48 % (ref 37.5–51.0)
Hemoglobin: 16.2 g/dL (ref 13.0–17.7)
MCH: 30.7 pg (ref 26.6–33.0)
MCHC: 33.8 g/dL (ref 31.5–35.7)
MCV: 91 fL (ref 79–97)
Platelets: 227 10*3/uL (ref 150–450)
RBC: 5.27 x10E6/uL (ref 4.14–5.80)
RDW: 13.8 % (ref 12.3–15.4)
WBC: 10.4 10*3/uL (ref 3.4–10.8)

## 2017-08-26 LAB — OCCULT BLOOD IMMUNOASSAY,DIAGNOSTIC: Occult blood fecal, by IA: POSITIVE — AB

## 2017-08-26 LAB — OCCULT BLOOD DIAGNOSTIC: OCCULT BLOOD, FECAL, IA: POSITIVE — AB

## 2017-09-08 NOTE — Telephone Encounter (Signed)
Informed pt's wife that where it says Ref range (negative) that is what you want the results to be. And where it says Value (positive) that is that actual results. Pt has already been seen by GI. Advised to continue treatment with GI. [facility] scheduled 09/16/17 and colonoscopy next month. Pt's wife verbalized her understanding.

## 2017-09-08 NOTE — Telephone Encounter (Signed)
Lawson FiscalLori, wife, called regarding lab results that were mailed. She was confused about the stool results stating it was positive and then negative. She asked for clarification. Please call [phone]

## 2017-09-16 ENCOUNTER — Inpatient Hospital Stay
Admit: 2017-09-16 | Discharge: 2017-09-16 | Disposition: A | Discharge status: Home / Self Care | Payer: BLUE CROSS/BLUE SHIELD | Attending: Emergency Medicine

## 2017-09-16 DIAGNOSIS — M542 Cervicalgia: Secondary | ICD-10-CM

## 2017-09-16 MED ORDER — METHOCARBAMOL 750 MG TAB
750 mg | ORAL_TABLET | Freq: Four times a day (QID) | ORAL | 0 refills | Status: AC
Start: 2017-09-16 — End: ?

## 2017-09-16 MED ORDER — METHOCARBAMOL 500 MG TAB
500 mg | ORAL | Status: AC
Start: 2017-09-16 — End: 2017-09-16
  Administered 2017-09-16: 16:00:00 via ORAL

## 2017-09-16 MED ORDER — DEXAMETHASONE SODIUM PHOSPHATE (PF) 10 MG/ML INJECTION
10 mg/mL | INTRAMUSCULAR | Status: AC
Start: 2017-09-16 — End: 2017-09-16
  Administered 2017-09-16: 16:00:00 via INTRAMUSCULAR

## 2017-09-16 MED ORDER — DEXAMETHASONE SODIUM PHOSPHATE (PF) 10 MG/ML INJECTION
10 mg/mL | INTRAMUSCULAR | Status: DC
Start: 2017-09-16 — End: 2017-09-16

## 2017-09-16 MED ORDER — LIDOCAINE 5 % (700 MG/PATCH) ADHESIVE PATCH
5 % | CUTANEOUS | Status: DC
Start: 2017-09-16 — End: 2017-09-16

## 2017-09-16 MED ORDER — LIDOCAINE 5 % (700 MG/PATCH) ADHESIVE PATCH
5 % | MEDICATED_PATCH | CUTANEOUS | 0 refills | Status: AC
Start: 2017-09-16 — End: 2017-09-30

## 2017-09-16 MED ORDER — SUCRALFATE 1 GRAM TAB
1 gram | ORAL_TABLET | Freq: Four times a day (QID) | ORAL | 0 refills | Status: DC
Start: 2017-09-16 — End: 2017-09-21

## 2017-09-16 MED ORDER — METHYLPREDNISOLONE 4 MG TABS IN A DOSE PACK
4 mg | ORAL | 0 refills | Status: AC
Start: 2017-09-16 — End: ?

## 2017-09-16 MED FILL — METHOCARBAMOL 500 MG TAB: 500 mg | ORAL | Qty: 1

## 2017-09-16 MED FILL — DEXAMETHASONE SODIUM PHOSPHATE (PF) 10 MG/ML INJECTION: 10 mg/mL | INTRAMUSCULAR | Qty: 1

## 2017-09-16 MED FILL — LIDOCAINE 5 % (700 MG/PATCH) ADHESIVE PATCH: 5 % | CUTANEOUS | Qty: 1

## 2017-09-16 NOTE — ED Provider Notes (Signed)
Pt presents to ED with complaints of pain/numbness R shoulder/arm x 1 week.  No specific recent injury.  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  Reports sxs started with pain in the right upper back, and progressed to involve pain in the right arm with burning/numbness in the 4th and 5th fingers, worse in certain positions and resolves in other positions.  No weakness noted.  No SOB.  Has had similar pain before, but usually resolved on it's own in less time.  Also reports he recently had abd US and is scheduled for endoscopy/colonoscopy for GI/GERD sxs, takes medications for that chronically.  + smoker, + EtOH 3-4 times per week.           Past Medical History:   Diagnosis Date   ??? Anxiety    ??? GERD (gastroesophageal reflux disease)        Past Surgical History:   Procedure Laterality Date   ??? HX VASECTOMY           History reviewed. No pertinent family history.    Social History     Socioeconomic History   ??? Marital status: MARRIED     Spouse name: Not on file   ??? Number of children: Not on file   ??? Years of education: Not on file   ??? Highest education level: Not on file   Occupational History   ??? Not on file   Social Needs   ??? Financial resource strain: Not on file   ??? Food insecurity:     Worry: Not on file     Inability: Not on file   ??? Transportation needs:     Medical: Not on file     Non-medical: Not on file   Tobacco Use   ??? Smoking status: Current Every Day Smoker     Packs/day: 2.00   ??? Smokeless tobacco: Never Used   ??? Tobacco comment: tried chantix   Substance and Sexual Activity   ??? Alcohol use: Yes     Comment: 2-3 beers with dinner, not every night but most nights   ??? Drug use: Never   ??? Sexual activity: Not on file   Lifestyle   ??? Physical activity:     Days per week: Not on file     Minutes per session: Not on file   ??? Stress: Not on file   Relationships   ??? Social connections:     Talks on phone: Not on file     Gets together: Not on file      Attends religious service: Not on file     Active member of club or organization: Not on file     Attends meetings of clubs or organizations: Not on file     Relationship status: Not on file   ??? Intimate partner violence:     Fear of current or ex partner: Not on file     Emotionally abused: Not on file     Physically abused: Not on file     Forced sexual activity: Not on file   Other Topics Concern   ??? Not on file   Social History Narrative   ??? Not on file         ALLERGIES: Pcn [penicillins]    Review of Systems   Constitutional: Negative for fever.   HENT: Negative.    Eyes: Negative.    Respiratory: Negative.    Cardiovascular:  Negative.  Negative for chest pain.   Gastrointestinal: Negative for vomiting.   Genitourinary: Negative.    Musculoskeletal: Positive for arthralgias, back pain and myalgias.   Skin: Negative for rash and wound.   Allergic/Immunologic: Negative for immunocompromised state.   Psychiatric/Behavioral: Negative.    All other systems reviewed and are negative.      Vitals:    09/16/17 1034   BP: (!) 143/103   Pulse: 63   Resp: 18   Temp: 98.6 ??F (37 ??C)   SpO2: 97%   Weight: 86.8 kg (191 lb 5.8 oz)   Height: 5\' 9"  (1.753 m)            Physical Exam   Constitutional: He is oriented to person, place, and time. He appears well-developed and well-nourished.   HENT:   Head: Normocephalic and atraumatic.   Eyes: Conjunctivae are normal.   Neck: Normal range of motion. Neck supple.   Diffuse tenderness R trapezius/paracervical muscular distribution with spasm.  Palpation here reproduces complaint per patient.  Normal ROM distally, strength, sensation, and vascular intact.   Abdominal: He exhibits no distension.   Musculoskeletal: He exhibits no edema or deformity.   See neck for further.  Mild diffuse tenderness of medial R arm, without focal bony point tenderness, joint effusion, erythema, warmth, edema.   NVI.   Neurological: He is alert and oriented to person, place, and time. No  cranial nerve deficit.   Skin: Skin is warm and dry. Capillary refill takes less than 2 seconds.   Vitals reviewed.       MDM  Number of Diagnoses or Management Options  Muscle spasm:   Neck pain:   Radiculopathy affecting upper extremity:   Diagnosis management comments: 47y/o M with h/o GERD, tobacco and EtOH use presents with R neck/arm pain.  HPI and PE as noted.  Explained to pt that sxs are most likely r/t inflammation/spasm and resulting radicular symptoms.  No indication for acute imaging at this time, though explained that if rest and conservative (I.e., symptomatic management) are not helpful, may need further eval by PCP and/or Ortho/Spine.  Management, medications, return precautions discussed.         Procedures

## 2017-09-16 NOTE — ED Notes (Signed)
Patient discharged by MD. Results and discharge instructions reviewed with the patient by MD. Patient verbalizes understanding. Patient discharged home, stable, ambulatory, in no acute distress.

## 2017-09-16 NOTE — ED Triage Notes (Signed)
TRIAGE NOTE: RIGHT shoulder and right arm pain and numbness x 1 week.  Patient states he has a very physical job. This has happened before, but not as bad as currently

## 2017-09-16 NOTE — ED Provider Notes (Signed)
Pt presents to ED with complaints of pain/numbness R shoulder/arm x 1 week.  No specific recent injury.  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  Reports sxs started with pain in the right upper back, and progressed to involve pain in the right arm with burning/numbness in the 4th and 5th fingers, worse in certain positions and resolves in other positions.  No weakness noted.  No SOB.  Has had similar pain before, but usually resolved on it's own in less time.  Also reports he recently had abd US and is scheduled for endoscopy/colonoscopy for GI/GERD sxs, takes medications for that chronically.  + smoker, + EtOH 3-4 times per week.           Past Medical History:   Diagnosis Date   ??? Anxiety    ??? GERD (gastroesophageal reflux disease)        Past Surgical History:   Procedure Laterality Date   ??? HX VASECTOMY           History reviewed. No pertinent family history.    Social History     Socioeconomic History   ??? Marital status: MARRIED     Spouse name: Not on file   ??? Number of children: Not on file   ??? Years of education: Not on file   ??? Highest education level: Not on file   Occupational History   ??? Not on file   Social Needs   ??? Financial resource strain: Not on file   ??? Food insecurity:     Worry: Not on file     Inability: Not on file   ??? Transportation needs:     Medical: Not on file     Non-medical: Not on file   Tobacco Use   ??? Smoking status: Current Every Day Smoker     Packs/day: 2.00   ??? Smokeless tobacco: Never Used   ??? Tobacco comment: tried chantix   Substance and Sexual Activity   ??? Alcohol use: Yes     Comment: 2-3 beers with dinner, not every night but most nights   ??? Drug use: Never   ??? Sexual activity: Not on file   Lifestyle   ??? Physical activity:     Days per week: Not on file     Minutes per session: Not on file   ??? Stress: Not on file   Relationships   ??? Social connections:     Talks on phone: Not on file     Gets together: Not on file     Attends  religious service: Not on file     Active member of club or organization: Not on file     Attends meetings of clubs or organizations: Not on file     Relationship status: Not on file   ??? Intimate partner violence:     Fear of current or ex partner: Not on file     Emotionally abused: Not on file     Physically abused: Not on file     Forced sexual activity: Not on file   Other Topics Concern   ??? Not on file   Social History Narrative   ??? Not on file         ALLERGIES: Pcn [penicillins]    Review of Systems   Constitutional: Negative for fever.   HENT: Negative.    Eyes: Negative.    Respiratory: Negative.    Cardiovascular:  Negative.  Negative for chest pain.   Gastrointestinal: Negative for vomiting.   Genitourinary: Negative.    Musculoskeletal: Positive for arthralgias, back pain and myalgias.   Skin: Negative for rash and wound.   Allergic/Immunologic: Negative for immunocompromised state.   Psychiatric/Behavioral: Negative.    All other systems reviewed and are negative.      Vitals:    09/16/17 1034   BP: (!) 143/103   Pulse: 63   Resp: 18   Temp: 98.6 ??F (37 ??C)   SpO2: 97%   Weight: 86.8 kg (191 lb 5.8 oz)   Height: 5\' 9"  (1.753 m)            Physical Exam   Constitutional: He is oriented to person, place, and time. He appears well-developed and well-nourished.   HENT:   Head: Normocephalic and atraumatic.   Eyes: Conjunctivae are normal.   Neck: Normal range of motion. Neck supple.   Diffuse tenderness R trapezius/paracervical muscular distribution with spasm.  Palpation here reproduces complaint per patient.  Normal ROM distally, strength, sensation, and vascular intact.   Abdominal: He exhibits no distension.   Musculoskeletal: He exhibits no edema or deformity.   See neck for further.  Mild diffuse tenderness of medial R arm, without focal bony point tenderness, joint effusion, erythema, warmth, edema.   NVI.   Neurological: He is alert and oriented to person, place, and time. No cranial nerve deficit.    Skin: Skin is warm and dry. Capillary refill takes less than 2 seconds.   Vitals reviewed.       MDM  Number of Diagnoses or Management Options  Muscle spasm:   Neck pain:   Radiculopathy affecting upper extremity:   Diagnosis management comments: 47y/o M with h/o GERD, tobacco and EtOH use presents with R neck/arm pain.  HPI and PE as noted.  Explained to pt that sxs are most likely r/t inflammation/spasm and resulting radicular symptoms.  No indication for acute imaging at this time, though explained that if rest and conservative (I.e., symptomatic management) are not helpful, may need further eval by PCP and/or Ortho/Spine.  Management, medications, return precautions discussed.         Procedures

## 2017-09-16 NOTE — ED Notes (Signed)
TRIAGE NOTE: RIGHT shoulder and right arm pain and numbness x 1 week.  Patient states he has a very physical job. This has happened before, but not as bad as currently

## 2017-09-19 ENCOUNTER — Encounter

## 2017-09-22 ENCOUNTER — Encounter

## 2017-09-22 MED ORDER — OMEPRAZOLE 40 MG CAP, DELAYED RELEASE
40 mg | ORAL_CAPSULE | ORAL | 3 refills | Status: DC
Start: 2017-09-22 — End: 2017-09-22

## 2017-09-22 MED ORDER — TRAZODONE 100 MG TAB
100 mg | ORAL_TABLET | ORAL | 3 refills | Status: DC
Start: 2017-09-22 — End: 2017-09-22

## 2017-09-22 NOTE — Telephone Encounter (Signed)
[facility] requesting Omeprazole 40mg  for pt refill.

## 2017-09-23 MED ORDER — OMEPRAZOLE 40 MG CAP, DELAYED RELEASE
40 mg | ORAL_CAPSULE | Freq: Every day | ORAL | 3 refills | Status: AC
Start: 2017-09-23 — End: ?

## 2017-09-23 MED ORDER — TRAZODONE 100 MG TAB
100 mg | ORAL_TABLET | Freq: Every evening | ORAL | 3 refills | Status: AC
Start: 2017-09-23 — End: ?

## 2017-10-09 NOTE — Telephone Encounter (Signed)
LVM to return call to office.

## 2017-10-09 NOTE — Telephone Encounter (Signed)
-----   Message from Metta ClinesLeandra D Simms sent at 10/09/2017  3:39 PM EDT -----  Regarding: Np Wood/ Telephone   Contact: [phone]  Pt requesting a return call regarding a endoscopy and colonoscopy that took place on 09/30/17.  Pt has not received results and would like a call back to discuss if the results in.

## 2017-10-12 NOTE — Telephone Encounter (Signed)
Pt calling for results from stool culture. Please advise.

## 2017-10-12 NOTE — Telephone Encounter (Signed)
LVM that patient should contact GI.  He knows that his stool test done from this office was positive and that is what sent him to GI needs to get that result from GI.

## 2017-10-15 NOTE — Progress Notes (Signed)
Received documentation from Dr. Jindal with [facility].  Polyps removed from his colon during colonoscopy were benign.  He was advised to have a repeat colonoscopy in 5 years.  Patient was already notified of results by Dr. Jindal.

## 2017-10-15 NOTE — Progress Notes (Signed)
Received documentation from Dr. Thomasene Lot with [facility].  Polyps removed from his colon during colonoscopy were benign.  He was advised to have a repeat colonoscopy in 5 years.  Patient was already notified of results by Dr. Thomasene Lot.

## 2022-10-15 ENCOUNTER — Encounter: Payer: Self-pay | Admitting: Emergency Medicine

## 2022-10-15 ENCOUNTER — Ambulatory Visit
Admission: EM | Admit: 2022-10-15 | Discharge: 2022-10-15 | Disposition: A | Discharge status: Home / Self Care | Payer: Self-pay | Attending: Internal Medicine | Admitting: Internal Medicine

## 2022-10-15 DIAGNOSIS — L03113 Cellulitis of right upper limb: Secondary | ICD-10-CM

## 2022-10-15 DIAGNOSIS — T22211A Burn of second degree of right forearm, initial encounter: Secondary | ICD-10-CM

## 2022-10-15 DIAGNOSIS — Z23 Encounter for immunization: Secondary | ICD-10-CM

## 2022-10-15 MED ORDER — SILVER SULFADIAZINE 1 % EX CREA: 1.0000 | TOPICAL_CREAM | Freq: Every day | CUTANEOUS | 0 refills | Status: AC

## 2022-10-15 MED ORDER — TETANUS-DIPHTH-ACELL PERTUSSIS 5-2.5-18.5 LF-MCG/0.5 IM SUSY
0.5000 mL | PREFILLED_SYRINGE | Freq: Once | INTRAMUSCULAR | Status: AC
  Administered 2022-10-15: 0.5 mL via INTRAMUSCULAR

## 2022-10-15 MED ORDER — DOXYCYCLINE HYCLATE 100 MG PO CAPS: 100.0000 mg | ORAL_CAPSULE | Freq: Two times a day (BID) | ORAL | 0 refills | Status: AC

## 2022-10-15 NOTE — Discharge Instructions (Addendum)
Please return in a few days to have burn reevaluated.  If burn worsens which includes increased redness, swelling, pus, please follow-up sooner.  I have prescribed an antibiotic to take by mouth as well as antibiotic topical cream that you apply once to twice daily.  Change dressing daily and as needed if it becomes soiled.

## 2022-10-15 NOTE — ED Triage Notes (Signed)
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  Now the area is red, clear, draining.  Patient did apply burn ointment and a bacterial ointment on the area.  Denies any OTC pain meds.  Unsure of last Tdap.

## 2022-10-15 NOTE — ED Provider Notes (Addendum)
[facility]    CSN: [id number] Arrival date & time: 10/15/22  1712      History   Chief Complaint Chief Complaint  Patient presents with   Burn    HPI SAAKETH GOODLET is a 52 y.o. male.   Patient presents with a burn to the right forearm that occurred about 5 days ago.  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  He is concerned given that he noticed some purulent drainage starting yesterday.  Denies any fever.  Has been applying Band-Aids and Neosporin ointment.  He is not sure of his last tetanus vaccine.   Burn   Past Medical History:  Diagnosis Date   Anxiety    GERD (gastroesophageal reflux disease)    Insomnia    Substance abuse (HCC)    ALCOHOL    Patient Active Problem List   Diagnosis Date Noted   GERD (gastroesophageal reflux disease) 03/11/2012   GAD (generalized anxiety disorder) 03/11/2012   Alcohol abuse 03/11/2012   Smoker 03/11/2012   Insomnia     Past Surgical History:  Procedure Laterality Date   VASECTOMY         Home Medications    Prior to Admission medications   Medication Sig Start Date End Date Taking? Authorizing Provider  doxycycline (VIBRAMYCIN) 100 MG capsule Take 1 capsule (100 mg total) by mouth 2 (two) times daily. 10/15/22  Yes Tyshaun Vinzant, Rolly Salter E, FNP  silver sulfADIAZINE (SILVADENE) 1 % cream Apply 1 Application topically daily. 10/15/22  Yes Shanee Batch, Rolly Salter E, FNP  buPROPion (WELLBUTRIN SR) 150 MG 12 hr tablet Take 1 tablet (150 mg total) by mouth 2 (two) times daily. 08/09/13   Dorena Bodo, PA-C  carisoprodol (SOMA) 350 MG tablet Take 1 tablet (350 mg total) by mouth 3 (three) times daily. 08/02/15   Ivery Quale, PA-C  clonazePAM (KLONOPIN) 1 MG tablet TAKE 1 TABLET BY MOUTH AT BEDTIME AS NEEDED. 08/10/13   Dorena Bodo, PA-C  ibuprofen (ADVIL,MOTRIN) 600 MG tablet Take 1 tablet (600 mg total) by mouth every 6 (six) hours as needed. 08/02/15   Ivery Quale, PA-C  omeprazole (PRILOSEC)  40 MG capsule Take 1 capsule (40 mg total) by mouth daily. 08/09/13   Dorena Bodo, PA-C  sertraline (ZOLOFT) 100 MG tablet Take 1 tablet (100 mg total) by mouth daily. 08/09/13   Dorena Bodo, PA-C  traMADol (ULTRAM) 50 MG tablet Take 1 tablet (50 mg total) by mouth every 6 (six) hours as needed. 08/02/15   Ivery Quale, PA-C    Family History History reviewed. No pertinent family history.  Social History Social History   Tobacco Use   Smoking status: Every Day    Current packs/day: 1.00    Types: Cigarettes   Smokeless tobacco: Never  Vaping Use   Vaping status: Never Used  Substance Use Topics   Alcohol use: No   Drug use: No     Allergies   Penicillins   Review of Systems Review of Systems Per HPI  Physical Exam Triage Vital Signs ED Triage Vitals  Encounter Vitals Group     BP 10/15/22 1816 116/75     Systolic BP Percentile --      Diastolic BP Percentile --      Pulse Rate 10/15/22 1816 92     Resp 10/15/22 1816 16     Temp 10/15/22 1816 98.1 F (36.7 C)     Temp Source 10/15/22 1816 Oral  SpO2 10/15/22 1816 93 %     Weight 10/15/22 1818 180 lb (81.6 kg)     Height 10/15/22 1818 5\' 9"  (1.753 m)     Head Circumference --      Peak Flow --      Pain Score 10/15/22 1818 4     Pain Loc --      Pain Education --      Exclude from Growth Chart --    No data found.  Updated Vital Signs BP 116/75 (BP Location: Left Arm)   Pulse 92   Temp 98.1 F (36.7 C) (Oral)   Resp 16   Ht 5\' 9"  (1.753 m)   Wt 180 lb (81.6 kg)   SpO2 93%   BMI 26.58 kg/m   Visual Acuity Right Eye Distance:   Left Eye Distance:   Bilateral Distance:    Right Eye Near:   Left Eye Near:    Bilateral Near:     Physical Exam Constitutional:      General: He is not in acute distress.    Appearance: Normal appearance. He is not toxic-appearing or diaphoretic.  HENT:     Head: Normocephalic and atraumatic.  Eyes:     Extraocular Movements: Extraocular movements intact.      Conjunctiva/sclera: Conjunctivae normal.  Pulmonary:     Effort: Pulmonary effort is normal.  Skin:    Comments: Patient has a linear burn present to right proximal forearm that is approximately 2 to 3 inches in diameter.  Mild amount of purulent drainage with surrounding erythema noted.  No blisters noted.  No significant swelling.  Full range of motion of arm present and patient is neurovascularly intact.  Neurological:     General: No focal deficit present.     Mental Status: He is alert and oriented to person, place, and time. Mental status is at baseline.  Psychiatric:        Mood and Affect: Mood normal.        Behavior: Behavior normal.        Thought Content: Thought content normal.        Judgment: Judgment normal.      UC Treatments / Results  Labs (all labs ordered are listed, but only abnormal results are displayed) Labs Reviewed - No data to display  EKG   Radiology No results found.  Procedures Procedures (including critical care time)  Medications Ordered in UC Medications  Tdap (BOOSTRIX) injection 0.5 mL (0.5 mLs Intramuscular Given 10/15/22 1839)    Initial Impression / Assessment and Plan / UC Course  I have reviewed the triage vital signs and the nursing notes.  Pertinent labs & imaging results that were available during my care of the patient were reviewed by me and considered in my medical decision making (see chart for details).     Patient has slightly superficial burn on right forearm which is concerning for cellulitis.  Will treat with doxycycline.  Patient advised to change dressing daily and as needed if it becomes soiled. will prescribe Silvadene cream to apply to area.  Dressing applied by clinical staff with Silvadene cream prior to discharge as well.  Tetanus vaccine updated today.  Advised to follow-up in a few days to have recheck of burn and to follow-up sooner if complications occur.  Patient verbalized understanding and was agreeable  with plan.  Oxygen in triage was 93% but patient left prior to recheck.  Suspect this is not accurate as patient is sitting  upright in chair in no acute distress and is not complaining of any shortness of breath. Final Clinical Impressions(s) / UC Diagnoses   Final diagnoses:  Partial thickness burn of right forearm, initial encounter  Cellulitis of right forearm     Discharge Instructions      Please return in a few days to have burn reevaluated.  If burn worsens which includes increased redness, swelling, pus, please follow-up sooner.  I have prescribed an antibiotic to take by mouth as well as antibiotic topical cream that you apply once to twice daily.  Change dressing daily and as needed if it becomes soiled.    ED Prescriptions     Medication Sig Dispense Auth. Provider   silver sulfADIAZINE (SILVADENE) 1 % cream Apply 1 Application topically daily. 50 g Ervin Knack E, [place]   doxycycline (VIBRAMYCIN) 100 MG capsule Take 1 capsule (100 mg total) by mouth 2 (two) times daily. 20 capsule Gustavus Bryant, [place]      PDMP not reviewed this encounter.   Gustavus Bryant, [place] 10/15/22 1847    Gustavus Bryant, [place] 10/15/22 [phone]

## 2023-04-09 ENCOUNTER — Ambulatory Visit
Admission: EM | Admit: 2023-04-09 | Discharge: 2023-04-09 | Disposition: A | Discharge status: Home / Self Care | Payer: Self-pay | Attending: Physician Assistant | Admitting: Physician Assistant

## 2023-04-09 DIAGNOSIS — H1033 Unspecified acute conjunctivitis, bilateral: Secondary | ICD-10-CM

## 2023-04-09 DIAGNOSIS — J309 Allergic rhinitis, unspecified: Secondary | ICD-10-CM

## 2023-04-09 MED ORDER — POLYMYXIN B-TRIMETHOPRIM 10000-0.1 UNIT/ML-% OP SOLN: 1.0000 [drp] | OPHTHALMIC | 0 refills | Status: AC

## 2023-04-09 MED ORDER — PREDNISONE 20 MG PO TABS: 40.0000 mg | ORAL_TABLET | Freq: Every day | ORAL | 0 refills | Status: AC

## 2023-04-09 NOTE — ED Provider Notes (Addendum)
 [facility]    CSN: [id number] Arrival date & time: 04/09/23  1529      History   Chief Complaint Chief Complaint  Patient presents with   Eye Problem    HPI Theodore Gray is a 53 y.o. male.   Patient here today for evaluation of cough, sore throat, congestion and hoarse voice that he has had the last 4 days.  He reports that he is also had itchy eyes that are red for the last 2 to 3 days with drainage.  He has not any fever.  He denies exposure to his eyes.  He has tried OTC meds without resolution.  The history is provided by the patient.  Eye Problem Associated symptoms: discharge, itching and redness   Associated symptoms: no nausea, no photophobia and no vomiting     Past Medical History:  Diagnosis Date   Anxiety    GERD (gastroesophageal reflux disease)    Insomnia    Substance abuse (HCC)    ALCOHOL    Patient Active Problem List   Diagnosis Date Noted   GERD (gastroesophageal reflux disease) 03/11/2012   GAD (generalized anxiety disorder) 03/11/2012   Alcohol abuse 03/11/2012   Smoker 03/11/2012   Insomnia     Past Surgical History:  Procedure Laterality Date   VASECTOMY         Home Medications    Prior to Admission medications   Medication Sig Start Date End Date Taking? Authorizing Provider  cycloSPORINE (RESTASIS) 0.05 % ophthalmic emulsion 1 drop 2 (two) times daily.   Yes [provider]  omeprazole (PRILOSEC) 40 MG capsule Take 1 capsule (40 mg total) by mouth daily. 08/09/13  Yes Dorena Bodo, PA-C  predniSONE (DELTASONE) 20 MG tablet Take 2 tablets (40 mg total) by mouth daily with breakfast for 5 days. 04/09/23 04/14/23 Yes Tomi Bamberger, PA-C  trimethoprim-polymyxin b (POLYTRIM) ophthalmic solution Place 1 drop into both eyes every 4 (four) hours for 7 days. 04/09/23 04/16/23 Yes Tomi Bamberger, PA-C  buPROPion [facility] SR) 150 MG 12 hr tablet Take 1 tablet (150 mg total) by mouth 2 (two) times daily. 08/09/13    Dorena Bodo, PA-C  carisoprodol (SOMA) 350 MG tablet Take 1 tablet (350 mg total) by mouth 3 (three) times daily. 08/02/15   Ivery Quale, PA-C  clonazePAM (KLONOPIN) 1 MG tablet TAKE 1 TABLET BY MOUTH AT BEDTIME AS NEEDED. 08/10/13   Dorena Bodo, PA-C  doxycycline (VIBRAMYCIN) 100 MG capsule Take 1 capsule (100 mg total) by mouth 2 (two) times daily. 10/15/22   Gustavus Bryant, FNP  ibuprofen (ADVIL,MOTRIN) 600 MG tablet Take 1 tablet (600 mg total) by mouth every 6 (six) hours as needed. 08/02/15   Ivery Quale, PA-C  sertraline (ZOLOFT) 100 MG tablet Take 1 tablet (100 mg total) by mouth daily. 08/09/13   Dorena Bodo, PA-C  silver sulfADIAZINE (SILVADENE) 1 % cream Apply 1 Application topically daily. 10/15/22   Gustavus Bryant, FNP  traMADol (ULTRAM) 50 MG tablet Take 1 tablet (50 mg total) by mouth every 6 (six) hours as needed. 08/02/15   Ivery Quale, PA-C    Family History History reviewed. No pertinent family history.  Social History Social History   Tobacco Use   Smoking status: Every Day    Current packs/day: 1.00    Types: Cigarettes   Smokeless tobacco: Never  Vaping Use   Vaping status: Never Used  Substance Use Topics   Alcohol  use: No   Drug use: No     Allergies   Penicillins   Review of Systems Review of Systems  Constitutional:  Negative for chills and fever.  HENT:  Positive for congestion, sore throat and voice change. Negative for ear pain.   Eyes:  Positive for discharge, redness and itching. Negative for photophobia.  Respiratory:  Positive for cough. Negative for shortness of breath.   Gastrointestinal:  Negative for abdominal pain, nausea and vomiting.     Physical Exam Triage Vital Signs ED Triage Vitals  Encounter Vitals Group     BP 04/09/23 1623 104/66     Systolic BP Percentile --      Diastolic BP Percentile --      Pulse Rate 04/09/23 1623 79     Resp 04/09/23 1623 18     Temp 04/09/23 1623 98.4 F (36.9 C)     Temp Source  04/09/23 1623 Oral     SpO2 04/09/23 1623 96 %     Weight 04/09/23 1621 180 lb (81.6 kg)     Height 04/09/23 1621 5\' 9"  (1.753 m)     Head Circumference --      Peak Flow --      Pain Score 04/09/23 1618 2     Pain Loc --      Pain Education --      Exclude from Growth Chart --    No data found.  Updated Vital Signs BP 104/66 (BP Location: Left Arm)   Pulse 79   Temp 98.4 F (36.9 C) (Oral)   Resp 18   Ht 5\' 9"  (1.753 m)   Wt 180 lb (81.6 kg)   SpO2 96%   BMI 26.58 kg/m   Visual Acuity Right Eye Distance: 20/25 (Uncorrected) Left Eye Distance: 20/25 (Uncorrected) Bilateral Distance: 20/20 (Uncorrected, Color Test Pass)  Right Eye Near:   Left Eye Near:    Bilateral Near:     Physical Exam Vitals and nursing note reviewed.  Constitutional:      General: He is not in acute distress.    Appearance: Normal appearance. He is not ill-appearing.  HENT:     Head: Normocephalic and atraumatic.     Nose: Congestion present.     Mouth/Throat:     Mouth: Mucous membranes are moist.     Pharynx: Oropharynx is clear. No oropharyngeal exudate or posterior oropharyngeal erythema.  Eyes:     Comments: Bilateral conjunctiva injected with mild drainage noted to right eye  Cardiovascular:     Rate and Rhythm: Normal rate and regular rhythm.     Heart sounds: Normal heart sounds. No murmur heard. Pulmonary:     Effort: Pulmonary effort is normal. No respiratory distress.     Breath sounds: Normal breath sounds. No wheezing, rhonchi or rales.  Skin:    General: Skin is warm and dry.  Neurological:     Mental Status: He is alert.  Psychiatric:        Mood and Affect: Mood normal.        Thought Content: Thought content normal.      UC Treatments / Results  Labs (all labs ordered are listed, but only abnormal results are displayed) Labs Reviewed - No data to display  EKG   Radiology No results found.  Procedures Procedures (including critical care  time)  Medications Ordered in UC Medications - No data to display  Initial Impression / Assessment and Plan / UC Course  I have  reviewed the triage vital signs and the nursing notes.  Pertinent labs & imaging results that were available during my care of the patient were reviewed by me and considered in my medical decision making (see chart for details).   Suspect upper respiratory symptoms are related to allergies.  Will treat with steroid burst to hopefully improve symptoms as well.  Will treat to cover bacterial conjunctivitis but also could be related to allergies.  Encouraged follow-up if no gradual improvement with symptoms or with any worsening.   Final Clinical Impressions(s) / UC Diagnoses   Final diagnoses:  Allergic rhinitis, unspecified seasonality, unspecified trigger  Acute conjunctivitis of both eyes, unspecified acute conjunctivitis type   Discharge Instructions   None    ED Prescriptions     Medication Sig Dispense Auth. Provider   predniSONE (DELTASONE) 20 MG tablet Take 2 tablets (40 mg total) by mouth daily with breakfast for 5 days. 10 tablet Tomi Bamberger, PA-C   trimethoprim-polymyxin b (POLYTRIM) ophthalmic solution Place 1 drop into both eyes every 4 (four) hours for 7 days. 10 mL Tomi Bamberger, PA-C      PDMP not reviewed this encounter.   Tomi Bamberger, PA-C 04/09/23 1824    Tomi Bamberger, PA-C 04/09/23 [phone]

## 2023-04-09 NOTE — ED Triage Notes (Signed)
"  Monday morning woke up with cough, sore throat, congestion and hoarse voice and then my eyes are red and itchy the last 2-3 days". No fever. "Nothing I have gotten in my eyes that I am aware of".

## 2023-08-09 ENCOUNTER — Emergency Department (HOSPITAL_COMMUNITY)
Admission: EM | Admit: 2023-08-09 | Discharge: 2023-08-09 | Disposition: A | Discharge status: Home / Self Care | Payer: Self-pay | Attending: Emergency Medicine | Admitting: Emergency Medicine

## 2023-08-09 ENCOUNTER — Other Ambulatory Visit: Payer: Self-pay

## 2023-08-09 ENCOUNTER — Emergency Department (HOSPITAL_COMMUNITY): Payer: Self-pay

## 2023-08-09 ENCOUNTER — Encounter (HOSPITAL_COMMUNITY): Payer: Self-pay | Admitting: *Deleted

## 2023-08-09 DIAGNOSIS — L0390 Cellulitis, unspecified: Secondary | ICD-10-CM

## 2023-08-09 DIAGNOSIS — L03116 Cellulitis of left lower limb: Secondary | ICD-10-CM | POA: Insufficient documentation

## 2023-08-09 DIAGNOSIS — L03114 Cellulitis of left upper limb: Secondary | ICD-10-CM | POA: Insufficient documentation

## 2023-08-09 LAB — COMPREHENSIVE METABOLIC PANEL WITH GFR
ALT: 18 U/L (ref 0–44)
AST: 35 U/L (ref 15–41)
Albumin: 4 g/dL (ref 3.5–5.0)
Alkaline Phosphatase: 67 U/L (ref 38–126)
Anion gap: 12 (ref 5–15)
BUN: 15 mg/dL (ref 6–20)
CO2: 24 mmol/L (ref 22–32)
Calcium: 8.7 mg/dL — ABNORMAL LOW (ref 8.9–10.3)
Chloride: 100 mmol/L (ref 98–111)
Creatinine, Ser: 1.02 mg/dL (ref 0.61–1.24)
GFR, Estimated: >60 mL/min (ref >60)
Glucose, Bld: 134 mg/dL — ABNORMAL HIGH (ref 70–99)
Potassium: 3.3 mmol/L — ABNORMAL LOW (ref 3.5–5.1)
Sodium: 136 mmol/L (ref 135–145)
Total Bilirubin: 0.4 mg/dL (ref 0.0–1.2)
Total Protein: 7.1 g/dL (ref 6.5–8.1)

## 2023-08-09 LAB — CBC WITH DIFFERENTIAL/PLATELET
Abs Immature Granulocytes: 0.04 K/uL (ref 0.00–0.07)
Basophils Absolute: 0 K/uL (ref 0.0–0.1)
Basophils Relative: 0 %
Eosinophils Absolute: 0.2 K/uL (ref 0.0–0.5)
Eosinophils Relative: 2 %
HCT: 42.9 % (ref 39.0–52.0)
Hemoglobin: 15 g/dL (ref 13.0–17.0)
Immature Granulocytes: 0 %
Lymphocytes Relative: 16 %
Lymphs Abs: 1.7 K/uL (ref 0.7–4.0)
MCH: 30.1 pg (ref 26.0–34.0)
MCHC: 35 g/dL (ref 30.0–36.0)
MCV: 86.1 fL (ref 80.0–100.0)
Monocytes Absolute: 0.6 K/uL (ref 0.1–1.0)
Monocytes Relative: 6 %
Neutro Abs: 7.6 K/uL (ref 1.7–7.7)
Neutrophils Relative %: 76 %
Platelets: 203 K/uL (ref 150–400)
RBC: 4.98 MIL/uL (ref 4.22–5.81)
RDW: 12.8 % (ref 11.5–15.5)
WBC: 10.1 K/uL (ref 4.0–10.5)
nRBC: 0 % (ref 0.0–0.2)

## 2023-08-09 LAB — PROTIME-INR
INR: 1 (ref 0.8–1.2)
Prothrombin Time: 13.3 s (ref 11.4–15.2)

## 2023-08-09 LAB — I-STAT CG4 LACTIC ACID, ED: Lactic Acid, Venous: 1.6 mmol/L (ref 0.5–1.9)

## 2023-08-09 MED ORDER — ACETAMINOPHEN 500 MG PO TABS
1000.0000 mg | ORAL_TABLET | Freq: Once | ORAL | Status: AC
  Administered 2023-08-09: 1000 mg via ORAL
  Filled 2023-08-09: qty 2

## 2023-08-09 MED ORDER — CLINDAMYCIN HCL 150 MG PO CAPS
300.0000 mg | ORAL_CAPSULE | Freq: Once | ORAL | Status: AC
  Administered 2023-08-09: 300 mg via ORAL
  Filled 2023-08-09: qty 2

## 2023-08-09 MED ORDER — CLINDAMYCIN HCL 300 MG PO CAPS: 300.0000 mg | ORAL_CAPSULE | Freq: Three times a day (TID) | ORAL | 0 refills | Status: AC

## 2023-08-09 NOTE — ED Notes (Signed)
 1st lac 1.6 in normal range, 2nd not needed can cancelled

## 2023-08-09 NOTE — ED Provider Notes (Signed)
 [place] [facility] Provider Note   CSN: [id number] Arrival date & time: 08/09/23  0006     History Chief Complaint  Patient presents with   poss spider bite    HPI Theodore Gray is a 53 y.o. male presenting for multiple skin lesions. States he found a spider in his bed Multiple red lesions scattered around his body No systemic symptoms.   Patient's recorded medical, surgical, social, medication list and allergies were reviewed in the Snapshot window as part of the initial history.   Review of Systems   Review of Systems  Constitutional:  Negative for chills and fever.  HENT:  Negative for ear pain and sore throat.   Eyes:  Negative for pain and visual disturbance.  Respiratory:  Negative for cough and shortness of breath.   Cardiovascular:  Negative for chest pain and palpitations.  Gastrointestinal:  Negative for abdominal pain and vomiting.  Genitourinary:  Negative for dysuria and hematuria.  Musculoskeletal:  Negative for arthralgias and back pain.  Skin:  Positive for rash. Negative for color change.  Neurological:  Negative for seizures and syncope.  All other systems reviewed and are negative.   Physical Exam Updated Vital Signs BP (!) 126/93 (BP Location: Right Arm)   Pulse 94   Temp 98.3 F (36.8 C)   Resp 18   Ht 5' 9 (1.753 m)   Wt 81.6 kg   SpO2 97%   BMI 26.57 kg/m  Physical Exam Vitals and nursing note reviewed.  Constitutional:      General: He is not in acute distress.    Appearance: He is well-developed.  HENT:     Head: Normocephalic and atraumatic.  Eyes:     Conjunctiva/sclera: Conjunctivae normal.  Cardiovascular:     Rate and Rhythm: Normal rate and regular rhythm.     Heart sounds: No murmur heard. Pulmonary:     Effort: Pulmonary effort is normal. No respiratory distress.     Breath sounds: Normal breath sounds.  Abdominal:     Palpations: Abdomen is soft.     Tenderness: There is no  abdominal tenderness.  Musculoskeletal:        General: No swelling.     Cervical back: Neck supple.  Skin:    General: Skin is warm and dry.     Capillary Refill: Capillary refill takes less than 2 seconds.  Neurological:     Mental Status: He is alert.  Psychiatric:        Mood and Affect: Mood normal.      ED Course/ Medical Decision Making/ A&P    Procedures Procedures   Medications Ordered in ED Medications  clindamycin  (CLEOCIN ) capsule 300 mg (has no administration in time range)  acetaminophen  (TYLENOL ) tablet 1,000 mg (has no administration in time range)    Medical Decision Making:   Theodore Gray is a 53 y.o. male who presented to the ED today with skin redness and irritation detailed above.    Patient placed on continuous vitals and telemetry monitoring while in ED which was reviewed periodically.  Complete initial physical exam performed, notably the patient  was HDS in NAD. Patient has multiple scattered <1cm circumscribed area of erythema to their UE and LE. No gross fluctuance.     Reviewed and confirmed nursing documentation for past medical history, family history, social history.    Initial Assessment:   With the patient's presentation of skin irritation, most likely diagnosis is cellulitis. Other  diagnoses were considered including (but not limited to) abscess/nec. Fasc/ systemic infection. These are considered less likely due to history of present illness and physical exam findings.   This is most consistent with an acute life/limb threatening illness complicated by underlying chronic conditions. Lack of systemic symptoms including fever/tachycardia makes severe infection less likely  Initial Plan:  Screening labs including CBC and Metabolic panel to evaluate for infectious or metabolic etiology of disease.  Objective evaluation as below reviewed   Initial Study Results:   Laboratory  All laboratory results reviewed without evidence of clinically  relevant pathology.   Reassessment and Plan:   US  showed no drain-able fluid collection nor any evidence of gas locules. Given early presentation and relatively stable symptoms, will treat with oral antibiotics and recommend reassessment by PCP in the outpatient setting.   Disposition:  I have considered need for hospitalization, however, considering all of the above, I believe this patient is stable for discharge at this time.  Patient/family educated about specific return precautions for given chief complaint and symptoms.  Patient/family educated about follow-up with PCP.     Patient/family expressed understanding of return precautions and need for follow-up. Patient spoken to regarding all imaging and laboratory results and appropriate follow up for these results. All education provided in verbal form with additional information in written form. Time was allowed for answering of patient questions. Patient discharged.    Emergency Department Medication Summary:   Medications  clindamycin  (CLEOCIN ) capsule 300 mg (has no administration in time range)  acetaminophen  (TYLENOL ) tablet 1,000 mg (has no administration in time range)         Clinical Impression:  1. Cellulitis, unspecified cellulitis site      Discharge   Final Clinical Impression(s) / ED Diagnoses Final diagnoses:  Cellulitis, unspecified cellulitis site    Rx / DC Orders ED Discharge Orders          Ordered    clindamycin  (CLEOCIN ) 300 MG capsule  3 times daily        08/09/23 0226              Theodore Meth, MD 08/09/23 [phone]

## 2023-08-09 NOTE — ED Triage Notes (Signed)
 The pt reports that he saw a spider on the floor and he thinks he was bitten 3-4 days ago on his lt arm  and his lt  leg from the lt knee down to his lt foot

## 2023-08-14 LAB — CULTURE, BLOOD (ROUTINE X 2)
Culture: NO GROWTH
Culture: NO GROWTH
